# Patient Record
Sex: Female | Born: 1965 | Race: White | Hispanic: No | Marital: Married | Smoking: Never smoker
Health system: Southern US, Community
[De-identification: ages and names within clinical notes are randomized; demographics above are authoritative.]

## PROBLEM LIST (undated history)

## (undated) DIAGNOSIS — I10 Essential (primary) hypertension: Secondary | ICD-10-CM

## (undated) DIAGNOSIS — I219 Acute myocardial infarction, unspecified: Secondary | ICD-10-CM

## (undated) DIAGNOSIS — D649 Anemia, unspecified: Secondary | ICD-10-CM

## (undated) DIAGNOSIS — Z9981 Dependence on supplemental oxygen: Secondary | ICD-10-CM

## (undated) DIAGNOSIS — Z9289 Personal history of other medical treatment: Secondary | ICD-10-CM

## (undated) DIAGNOSIS — J189 Pneumonia, unspecified organism: Secondary | ICD-10-CM

## (undated) DIAGNOSIS — F329 Major depressive disorder, single episode, unspecified: Secondary | ICD-10-CM

## (undated) DIAGNOSIS — F32A Depression, unspecified: Secondary | ICD-10-CM

## (undated) DIAGNOSIS — N189 Chronic kidney disease, unspecified: Secondary | ICD-10-CM

## (undated) DIAGNOSIS — R413 Other amnesia: Secondary | ICD-10-CM

## (undated) DIAGNOSIS — G43909 Migraine, unspecified, not intractable, without status migrainosus: Secondary | ICD-10-CM

## (undated) DIAGNOSIS — R011 Cardiac murmur, unspecified: Secondary | ICD-10-CM

## (undated) DIAGNOSIS — E119 Type 2 diabetes mellitus without complications: Secondary | ICD-10-CM

## (undated) HISTORY — PX: CHOLECYSTECTOMY OPEN: SUR202

---

## 2014-02-15 HISTORY — PX: FOOT AMPUTATION THROUGH METATARSAL: SHX644

## 2016-08-30 DIAGNOSIS — M86171 Other acute osteomyelitis, right ankle and foot: Secondary | ICD-10-CM

## 2016-08-30 DIAGNOSIS — Z89432 Acquired absence of left foot: Secondary | ICD-10-CM

## 2016-08-30 DIAGNOSIS — E1140 Type 2 diabetes mellitus with diabetic neuropathy, unspecified: Secondary | ICD-10-CM

## 2016-08-30 DIAGNOSIS — E860 Dehydration: Secondary | ICD-10-CM

## 2016-08-30 DIAGNOSIS — E11621 Type 2 diabetes mellitus with foot ulcer: Secondary | ICD-10-CM

## 2016-08-30 DIAGNOSIS — N179 Acute kidney failure, unspecified: Secondary | ICD-10-CM

## 2016-08-30 DIAGNOSIS — E109 Type 1 diabetes mellitus without complications: Secondary | ICD-10-CM

## 2016-08-30 DIAGNOSIS — I10 Essential (primary) hypertension: Secondary | ICD-10-CM

## 2016-08-30 DIAGNOSIS — D649 Anemia, unspecified: Secondary | ICD-10-CM

## 2016-08-31 DIAGNOSIS — M86171 Other acute osteomyelitis, right ankle and foot: Secondary | ICD-10-CM

## 2016-08-31 DIAGNOSIS — N179 Acute kidney failure, unspecified: Secondary | ICD-10-CM

## 2016-08-31 DIAGNOSIS — E860 Dehydration: Secondary | ICD-10-CM

## 2016-08-31 DIAGNOSIS — E119 Type 2 diabetes mellitus without complications: Secondary | ICD-10-CM

## 2016-08-31 DIAGNOSIS — Z89432 Acquired absence of left foot: Secondary | ICD-10-CM

## 2016-08-31 DIAGNOSIS — E11621 Type 2 diabetes mellitus with foot ulcer: Secondary | ICD-10-CM

## 2016-08-31 DIAGNOSIS — J9601 Acute respiratory failure with hypoxia: Secondary | ICD-10-CM

## 2016-09-01 DIAGNOSIS — E11621 Type 2 diabetes mellitus with foot ulcer: Secondary | ICD-10-CM

## 2016-09-01 DIAGNOSIS — M86171 Other acute osteomyelitis, right ankle and foot: Secondary | ICD-10-CM

## 2016-09-13 DIAGNOSIS — E11621 Type 2 diabetes mellitus with foot ulcer: Secondary | ICD-10-CM

## 2016-09-13 DIAGNOSIS — J90 Pleural effusion, not elsewhere classified: Secondary | ICD-10-CM

## 2016-09-13 DIAGNOSIS — R079 Chest pain, unspecified: Secondary | ICD-10-CM

## 2016-09-13 DIAGNOSIS — I10 Essential (primary) hypertension: Secondary | ICD-10-CM

## 2016-09-13 DIAGNOSIS — N189 Chronic kidney disease, unspecified: Secondary | ICD-10-CM

## 2016-09-15 ENCOUNTER — Encounter: Payer: Self-pay | Admitting: Sports Medicine

## 2016-09-22 ENCOUNTER — Ambulatory Visit: Payer: Self-pay | Admitting: Sports Medicine

## 2016-09-23 DIAGNOSIS — E860 Dehydration: Secondary | ICD-10-CM

## 2016-09-23 DIAGNOSIS — M86171 Other acute osteomyelitis, right ankle and foot: Secondary | ICD-10-CM

## 2016-09-23 DIAGNOSIS — J189 Pneumonia, unspecified organism: Secondary | ICD-10-CM

## 2016-09-23 DIAGNOSIS — J9601 Acute respiratory failure with hypoxia: Secondary | ICD-10-CM

## 2016-09-23 DIAGNOSIS — E119 Type 2 diabetes mellitus without complications: Secondary | ICD-10-CM

## 2016-09-23 DIAGNOSIS — N179 Acute kidney failure, unspecified: Secondary | ICD-10-CM

## 2016-09-25 DIAGNOSIS — I10 Essential (primary) hypertension: Secondary | ICD-10-CM

## 2016-09-25 DIAGNOSIS — J189 Pneumonia, unspecified organism: Secondary | ICD-10-CM

## 2016-09-25 DIAGNOSIS — N179 Acute kidney failure, unspecified: Secondary | ICD-10-CM

## 2016-09-25 DIAGNOSIS — R109 Unspecified abdominal pain: Secondary | ICD-10-CM

## 2016-09-25 DIAGNOSIS — J9601 Acute respiratory failure with hypoxia: Secondary | ICD-10-CM

## 2016-09-27 DIAGNOSIS — J90 Pleural effusion, not elsewhere classified: Secondary | ICD-10-CM

## 2016-09-27 DIAGNOSIS — J9601 Acute respiratory failure with hypoxia: Secondary | ICD-10-CM

## 2016-09-27 DIAGNOSIS — E119 Type 2 diabetes mellitus without complications: Secondary | ICD-10-CM

## 2016-10-26 ENCOUNTER — Ambulatory Visit: Payer: Self-pay | Admitting: Sports Medicine

## 2016-10-28 DIAGNOSIS — E872 Acidosis: Secondary | ICD-10-CM

## 2016-10-28 DIAGNOSIS — E860 Dehydration: Secondary | ICD-10-CM

## 2016-10-28 DIAGNOSIS — Z89432 Acquired absence of left foot: Secondary | ICD-10-CM

## 2016-10-28 DIAGNOSIS — N183 Chronic kidney disease, stage 3 (moderate): Secondary | ICD-10-CM

## 2016-10-28 DIAGNOSIS — R112 Nausea with vomiting, unspecified: Secondary | ICD-10-CM

## 2016-10-28 DIAGNOSIS — E1140 Type 2 diabetes mellitus with diabetic neuropathy, unspecified: Secondary | ICD-10-CM

## 2016-10-28 DIAGNOSIS — E11621 Type 2 diabetes mellitus with foot ulcer: Secondary | ICD-10-CM

## 2016-10-28 DIAGNOSIS — I10 Essential (primary) hypertension: Secondary | ICD-10-CM

## 2016-11-03 ENCOUNTER — Ambulatory Visit: Payer: Self-pay | Admitting: Internal Medicine

## 2016-12-27 DIAGNOSIS — R112 Nausea with vomiting, unspecified: Secondary | ICD-10-CM | POA: Diagnosis not present

## 2016-12-27 DIAGNOSIS — N183 Chronic kidney disease, stage 3 (moderate): Secondary | ICD-10-CM | POA: Diagnosis not present

## 2016-12-27 DIAGNOSIS — E1140 Type 2 diabetes mellitus with diabetic neuropathy, unspecified: Secondary | ICD-10-CM

## 2016-12-27 DIAGNOSIS — I10 Essential (primary) hypertension: Secondary | ICD-10-CM

## 2016-12-27 DIAGNOSIS — D649 Anemia, unspecified: Secondary | ICD-10-CM

## 2016-12-28 DIAGNOSIS — E1140 Type 2 diabetes mellitus with diabetic neuropathy, unspecified: Secondary | ICD-10-CM | POA: Diagnosis not present

## 2016-12-28 DIAGNOSIS — I10 Essential (primary) hypertension: Secondary | ICD-10-CM | POA: Diagnosis not present

## 2016-12-28 DIAGNOSIS — N183 Chronic kidney disease, stage 3 (moderate): Secondary | ICD-10-CM | POA: Diagnosis not present

## 2016-12-28 DIAGNOSIS — D649 Anemia, unspecified: Secondary | ICD-10-CM | POA: Diagnosis not present

## 2016-12-29 DIAGNOSIS — I10 Essential (primary) hypertension: Secondary | ICD-10-CM | POA: Diagnosis not present

## 2016-12-29 DIAGNOSIS — E1140 Type 2 diabetes mellitus with diabetic neuropathy, unspecified: Secondary | ICD-10-CM | POA: Diagnosis not present

## 2016-12-29 DIAGNOSIS — N183 Chronic kidney disease, stage 3 (moderate): Secondary | ICD-10-CM | POA: Diagnosis not present

## 2016-12-29 DIAGNOSIS — D649 Anemia, unspecified: Secondary | ICD-10-CM | POA: Diagnosis not present

## 2016-12-30 ENCOUNTER — Encounter (HOSPITAL_COMMUNITY): Payer: Self-pay | Admitting: General Practice

## 2016-12-30 ENCOUNTER — Inpatient Hospital Stay (HOSPITAL_COMMUNITY): Payer: Medicaid Other

## 2016-12-30 ENCOUNTER — Inpatient Hospital Stay (HOSPITAL_COMMUNITY)
Admission: AD | Admit: 2016-12-30 | Discharge: 2017-01-08 | DRG: 193 | Disposition: A | Discharge status: Home Health | Payer: Medicaid Other | Source: Other Acute Inpatient Hospital | Attending: Internal Medicine | Admitting: Internal Medicine

## 2016-12-30 DIAGNOSIS — J80 Acute respiratory distress syndrome: Secondary | ICD-10-CM | POA: Diagnosis present

## 2016-12-30 DIAGNOSIS — I469 Cardiac arrest, cause unspecified: Secondary | ICD-10-CM | POA: Diagnosis not present

## 2016-12-30 DIAGNOSIS — I130 Hypertensive heart and chronic kidney disease with heart failure and stage 1 through stage 4 chronic kidney disease, or unspecified chronic kidney disease: Secondary | ICD-10-CM | POA: Diagnosis present

## 2016-12-30 DIAGNOSIS — E1140 Type 2 diabetes mellitus with diabetic neuropathy, unspecified: Secondary | ICD-10-CM | POA: Diagnosis present

## 2016-12-30 DIAGNOSIS — R0902 Hypoxemia: Secondary | ICD-10-CM

## 2016-12-30 DIAGNOSIS — T8789 Other complications of amputation stump: Secondary | ICD-10-CM | POA: Diagnosis present

## 2016-12-30 DIAGNOSIS — Y95 Nosocomial condition: Secondary | ICD-10-CM | POA: Diagnosis present

## 2016-12-30 DIAGNOSIS — R509 Fever, unspecified: Secondary | ICD-10-CM

## 2016-12-30 DIAGNOSIS — J9690 Respiratory failure, unspecified, unspecified whether with hypoxia or hypercapnia: Secondary | ICD-10-CM

## 2016-12-30 DIAGNOSIS — R918 Other nonspecific abnormal finding of lung field: Secondary | ICD-10-CM

## 2016-12-30 DIAGNOSIS — E874 Mixed disorder of acid-base balance: Secondary | ICD-10-CM | POA: Diagnosis present

## 2016-12-30 DIAGNOSIS — E875 Hyperkalemia: Secondary | ICD-10-CM | POA: Diagnosis not present

## 2016-12-30 DIAGNOSIS — E1151 Type 2 diabetes mellitus with diabetic peripheral angiopathy without gangrene: Secondary | ICD-10-CM | POA: Diagnosis present

## 2016-12-30 DIAGNOSIS — E872 Acidosis: Secondary | ICD-10-CM | POA: Diagnosis not present

## 2016-12-30 DIAGNOSIS — I5031 Acute diastolic (congestive) heart failure: Secondary | ICD-10-CM | POA: Diagnosis present

## 2016-12-30 DIAGNOSIS — G43909 Migraine, unspecified, not intractable, without status migrainosus: Secondary | ICD-10-CM | POA: Diagnosis not present

## 2016-12-30 DIAGNOSIS — J181 Lobar pneumonia, unspecified organism: Principal | ICD-10-CM

## 2016-12-30 DIAGNOSIS — N184 Chronic kidney disease, stage 4 (severe): Secondary | ICD-10-CM | POA: Diagnosis present

## 2016-12-30 DIAGNOSIS — L97522 Non-pressure chronic ulcer of other part of left foot with fat layer exposed: Secondary | ICD-10-CM | POA: Diagnosis not present

## 2016-12-30 DIAGNOSIS — I2510 Atherosclerotic heart disease of native coronary artery without angina pectoris: Secondary | ICD-10-CM | POA: Diagnosis not present

## 2016-12-30 DIAGNOSIS — Z794 Long term (current) use of insulin: Secondary | ICD-10-CM

## 2016-12-30 DIAGNOSIS — D631 Anemia in chronic kidney disease: Secondary | ICD-10-CM | POA: Diagnosis present

## 2016-12-30 DIAGNOSIS — I2699 Other pulmonary embolism without acute cor pulmonale: Secondary | ICD-10-CM

## 2016-12-30 DIAGNOSIS — Z833 Family history of diabetes mellitus: Secondary | ICD-10-CM | POA: Diagnosis not present

## 2016-12-30 DIAGNOSIS — E08621 Diabetes mellitus due to underlying condition with foot ulcer: Secondary | ICD-10-CM | POA: Diagnosis not present

## 2016-12-30 DIAGNOSIS — J9601 Acute respiratory failure with hypoxia: Secondary | ICD-10-CM

## 2016-12-30 DIAGNOSIS — G4733 Obstructive sleep apnea (adult) (pediatric): Secondary | ICD-10-CM | POA: Diagnosis present

## 2016-12-30 DIAGNOSIS — J9600 Acute respiratory failure, unspecified whether with hypoxia or hypercapnia: Secondary | ICD-10-CM | POA: Diagnosis not present

## 2016-12-30 DIAGNOSIS — I10 Essential (primary) hypertension: Secondary | ICD-10-CM | POA: Diagnosis not present

## 2016-12-30 DIAGNOSIS — L97529 Non-pressure chronic ulcer of other part of left foot with unspecified severity: Secondary | ICD-10-CM | POA: Diagnosis not present

## 2016-12-30 DIAGNOSIS — E1122 Type 2 diabetes mellitus with diabetic chronic kidney disease: Secondary | ICD-10-CM | POA: Diagnosis present

## 2016-12-30 DIAGNOSIS — Z9049 Acquired absence of other specified parts of digestive tract: Secondary | ICD-10-CM | POA: Diagnosis not present

## 2016-12-30 DIAGNOSIS — I252 Old myocardial infarction: Secondary | ICD-10-CM | POA: Diagnosis not present

## 2016-12-30 DIAGNOSIS — N179 Acute kidney failure, unspecified: Secondary | ICD-10-CM | POA: Diagnosis present

## 2016-12-30 DIAGNOSIS — R1310 Dysphagia, unspecified: Secondary | ICD-10-CM | POA: Diagnosis not present

## 2016-12-30 DIAGNOSIS — J189 Pneumonia, unspecified organism: Secondary | ICD-10-CM | POA: Diagnosis present

## 2016-12-30 DIAGNOSIS — Z91040 Latex allergy status: Secondary | ICD-10-CM

## 2016-12-30 DIAGNOSIS — N170 Acute kidney failure with tubular necrosis: Secondary | ICD-10-CM | POA: Diagnosis not present

## 2016-12-30 DIAGNOSIS — E1165 Type 2 diabetes mellitus with hyperglycemia: Secondary | ICD-10-CM | POA: Diagnosis present

## 2016-12-30 DIAGNOSIS — Z9981 Dependence on supplemental oxygen: Secondary | ICD-10-CM

## 2016-12-30 DIAGNOSIS — N183 Chronic kidney disease, stage 3 unspecified: Secondary | ICD-10-CM | POA: Diagnosis present

## 2016-12-30 DIAGNOSIS — E11621 Type 2 diabetes mellitus with foot ulcer: Secondary | ICD-10-CM | POA: Diagnosis present

## 2016-12-30 DIAGNOSIS — E871 Hypo-osmolality and hyponatremia: Secondary | ICD-10-CM | POA: Diagnosis not present

## 2016-12-30 DIAGNOSIS — I429 Cardiomyopathy, unspecified: Secondary | ICD-10-CM | POA: Diagnosis not present

## 2016-12-30 DIAGNOSIS — D649 Anemia, unspecified: Secondary | ICD-10-CM | POA: Diagnosis not present

## 2016-12-30 DIAGNOSIS — L97509 Non-pressure chronic ulcer of other part of unspecified foot with unspecified severity: Secondary | ICD-10-CM | POA: Diagnosis present

## 2016-12-30 DIAGNOSIS — R011 Cardiac murmur, unspecified: Secondary | ICD-10-CM | POA: Diagnosis present

## 2016-12-30 HISTORY — DX: Acute myocardial infarction, unspecified: I21.9

## 2016-12-30 HISTORY — DX: Depression, unspecified: F32.A

## 2016-12-30 HISTORY — DX: Type 2 diabetes mellitus without complications: E11.9

## 2016-12-30 HISTORY — DX: Essential (primary) hypertension: I10

## 2016-12-30 HISTORY — DX: Other amnesia: R41.3

## 2016-12-30 HISTORY — DX: Chronic kidney disease, unspecified: N18.9

## 2016-12-30 HISTORY — DX: Anemia, unspecified: D64.9

## 2016-12-30 HISTORY — DX: Personal history of other medical treatment: Z92.89

## 2016-12-30 HISTORY — DX: Major depressive disorder, single episode, unspecified: F32.9

## 2016-12-30 HISTORY — DX: Cardiac murmur, unspecified: R01.1

## 2016-12-30 HISTORY — DX: Pneumonia, unspecified organism: J18.9

## 2016-12-30 HISTORY — DX: Migraine, unspecified, not intractable, without status migrainosus: G43.909

## 2016-12-30 HISTORY — DX: Dependence on supplemental oxygen: Z99.81

## 2016-12-30 LAB — BLOOD GAS, ARTERIAL
Acid-base deficit: 6.8 mmol/L — ABNORMAL HIGH (ref 0.0–2.0)
Acid-base deficit: 10.4 mmol/L — ABNORMAL HIGH (ref 0.0–2.0)
BICARBONATE: 15.8 mmol/L — AB (ref 20.0–28.0)
Bicarbonate: 18.7 mmol/L — ABNORMAL LOW (ref 20.0–28.0)
DRAWN BY: 27553
Drawn by: 448981
FIO2: 80
FIO2: 100
O2 Saturation: 96.2 %
O2 Saturation: 94 %
PATIENT TEMPERATURE: 98.6
PCO2 ART: 40.6 mmHg (ref 32.0–48.0)
PH ART: 7.285 — AB (ref 7.350–7.450)
PH ART: 7.219 — AB (ref 7.350–7.450)
PO2 ART: 87.4 mmHg (ref 83.0–108.0)
Patient temperature: 98.6
pCO2 arterial: 40.3 mmHg (ref 32.0–48.0)
pO2, Arterial: 80.7 mmHg — ABNORMAL LOW (ref 83.0–108.0)

## 2016-12-30 LAB — CBC WITH DIFFERENTIAL/PLATELET
Basophils Absolute: 0.1 10*3/uL (ref 0.0–0.1)
Basophils Relative: 1 %
Eosinophils Absolute: 0.2 10*3/uL (ref 0.0–0.7)
Eosinophils Relative: 3 %
HCT: 23.1 % — ABNORMAL LOW (ref 36.0–46.0)
Hemoglobin: 7.1 g/dL — ABNORMAL LOW (ref 12.0–15.0)
LYMPHS ABS: 1.9 10*3/uL (ref 0.7–4.0)
LYMPHS PCT: 22 %
MCH: 25.7 pg — AB (ref 26.0–34.0)
MCHC: 30.7 g/dL (ref 30.0–36.0)
MCV: 83.7 fL (ref 78.0–100.0)
MONO ABS: 0.8 10*3/uL (ref 0.1–1.0)
Monocytes Relative: 9 %
Neutro Abs: 5.6 10*3/uL (ref 1.7–7.7)
Neutrophils Relative %: 65 %
PLATELETS: 362 10*3/uL (ref 150–400)
RBC: 2.76 MIL/uL — AB (ref 3.87–5.11)
RDW: 16.4 % — ABNORMAL HIGH (ref 11.5–15.5)
WBC: 8.6 10*3/uL (ref 4.0–10.5)

## 2016-12-30 LAB — COMPREHENSIVE METABOLIC PANEL
ALBUMIN: 2.2 g/dL — AB (ref 3.5–5.0)
ALT: 19 U/L (ref 14–54)
AST: 23 U/L (ref 15–41)
Alkaline Phosphatase: 154 U/L — ABNORMAL HIGH (ref 38–126)
Anion gap: 10 (ref 5–15)
BILIRUBIN TOTAL: 0.6 mg/dL (ref 0.3–1.2)
BUN: 29 mg/dL — AB (ref 6–20)
CHLORIDE: 103 mmol/L (ref 101–111)
CO2: 17 mmol/L — AB (ref 22–32)
CREATININE: 3.51 mg/dL — AB (ref 0.44–1.00)
Calcium: 7.7 mg/dL — ABNORMAL LOW (ref 8.9–10.3)
GFR calc Af Amer: 16 mL/min — ABNORMAL LOW (ref >60)
GFR calc non Af Amer: 14 mL/min — ABNORMAL LOW (ref >60)
GLUCOSE: 163 mg/dL — AB (ref 65–99)
Potassium: 4.7 mmol/L (ref 3.5–5.1)
SODIUM: 130 mmol/L — AB (ref 135–145)
Total Protein: 6.7 g/dL (ref 6.5–8.1)

## 2016-12-30 LAB — PROCALCITONIN: Procalcitonin: 0.79 ng/mL

## 2016-12-30 LAB — BASIC METABOLIC PANEL
Anion gap: 10 (ref 5–15)
BUN: 29 mg/dL — AB (ref 6–20)
CHLORIDE: 103 mmol/L (ref 101–111)
CO2: 19 mmol/L — AB (ref 22–32)
Calcium: 7.8 mg/dL — ABNORMAL LOW (ref 8.9–10.3)
Creatinine, Ser: 3.48 mg/dL — ABNORMAL HIGH (ref 0.44–1.00)
GFR calc Af Amer: 17 mL/min — ABNORMAL LOW (ref >60)
GFR calc non Af Amer: 14 mL/min — ABNORMAL LOW (ref >60)
GLUCOSE: 121 mg/dL — AB (ref 65–99)
POTASSIUM: 4.8 mmol/L (ref 3.5–5.1)
Sodium: 132 mmol/L — ABNORMAL LOW (ref 135–145)

## 2016-12-30 LAB — URINALYSIS, ROUTINE W REFLEX MICROSCOPIC
BILIRUBIN URINE: NEGATIVE
BILIRUBIN URINE: NEGATIVE
Glucose, UA: 150 mg/dL — AB
HGB URINE DIPSTICK: NEGATIVE
KETONES UR: NEGATIVE mg/dL
Ketones, ur: NEGATIVE mg/dL
LEUKOCYTES UA: NEGATIVE
Nitrite: NEGATIVE
Nitrite: NEGATIVE
PH: 5 (ref 5.0–8.0)
PH: 5 (ref 5.0–8.0)
Protein, ur: 100 mg/dL — AB
SPECIFIC GRAVITY, URINE: 1.015 (ref 1.005–1.030)
Specific Gravity, Urine: 1.013 (ref 1.005–1.030)

## 2016-12-30 LAB — CBC
HEMATOCRIT: 24 % — AB (ref 36.0–46.0)
HEMOGLOBIN: 7.4 g/dL — AB (ref 12.0–15.0)
MCH: 25.7 pg — AB (ref 26.0–34.0)
MCHC: 30.8 g/dL (ref 30.0–36.0)
MCV: 83.3 fL (ref 78.0–100.0)
Platelets: 361 10*3/uL (ref 150–400)
RBC: 2.88 MIL/uL — ABNORMAL LOW (ref 3.87–5.11)
RDW: 16.4 % — ABNORMAL HIGH (ref 11.5–15.5)
WBC: 9.5 10*3/uL (ref 4.0–10.5)

## 2016-12-30 LAB — MRSA PCR SCREENING: MRSA by PCR: NEGATIVE

## 2016-12-30 LAB — TSH: TSH: 2.946 u[IU]/mL (ref 0.350–4.500)

## 2016-12-30 LAB — APTT: aPTT: 36 seconds (ref 24–36)

## 2016-12-30 LAB — GLUCOSE, CAPILLARY
Glucose-Capillary: 159 mg/dL — ABNORMAL HIGH (ref 65–99)
Glucose-Capillary: 217 mg/dL — ABNORMAL HIGH (ref 65–99)
Glucose-Capillary: 249 mg/dL — ABNORMAL HIGH (ref 65–99)
Glucose-Capillary: 119 mg/dL — ABNORMAL HIGH (ref 65–99)

## 2016-12-30 LAB — PREALBUMIN: Prealbumin: 11.5 mg/dL — ABNORMAL LOW (ref 18–38)

## 2016-12-30 LAB — PROTIME-INR
INR: 1.17
Prothrombin Time: 14.9 seconds (ref 11.4–15.2)

## 2016-12-30 LAB — STREP PNEUMONIAE URINARY ANTIGEN: Strep Pneumo Urinary Antigen: NEGATIVE

## 2016-12-30 LAB — MAGNESIUM
Magnesium: 1.7 mg/dL (ref 1.7–2.4)
Magnesium: 1.7 mg/dL (ref 1.7–2.4)

## 2016-12-30 LAB — LACTIC ACID, PLASMA: Lactic Acid, Venous: 0.8 mmol/L (ref 0.5–1.9)

## 2016-12-30 LAB — VANCOMYCIN, RANDOM: Vancomycin Rm: 17

## 2016-12-30 LAB — PHOSPHORUS: Phosphorus: 3.9 mg/dL (ref 2.5–4.6)

## 2016-12-30 LAB — TROPONIN I: TROPONIN I: 0.04 ng/mL — AB (ref <0.03)

## 2016-12-30 MED ORDER — ORAL CARE MOUTH RINSE
15.0000 mL | Freq: Two times a day (BID) | OROMUCOSAL | Status: DC
  Administered 2016-12-30 – 2017-01-08 (×12): 15 mL via OROMUCOSAL

## 2016-12-30 MED ORDER — ACETAMINOPHEN 325 MG PO TABS
650.0000 mg | ORAL_TABLET | Freq: Four times a day (QID) | ORAL | Status: DC | PRN
  Administered 2016-12-31 – 2017-01-07 (×9): 650 mg via ORAL
  Filled 2016-12-30 (×10): qty 2

## 2016-12-30 MED ORDER — AZITHROMYCIN 250 MG PO TABS: 250.0000 mg | ORAL_TABLET | Freq: Every day | ORAL | Status: DC

## 2016-12-30 MED ORDER — STERILE WATER FOR INJECTION IV SOLN
INTRAVENOUS | Status: DC
  Administered 2016-12-30: 21:00:00 via INTRAVENOUS
  Filled 2016-12-30 (×3): qty 850

## 2016-12-30 MED ORDER — ZOLPIDEM TARTRATE 5 MG PO TABS: 5.0000 mg | ORAL_TABLET | Freq: Every evening | ORAL | Status: DC | PRN

## 2016-12-30 MED ORDER — SODIUM CHLORIDE 0.9% FLUSH: 3.0000 mL | INTRAVENOUS | Status: DC | PRN

## 2016-12-30 MED ORDER — ACETAMINOPHEN 650 MG RE SUPP: 650.0000 mg | Freq: Four times a day (QID) | RECTAL | Status: DC | PRN

## 2016-12-30 MED ORDER — POTASSIUM CHLORIDE IN NACL 20-0.9 MEQ/L-% IV SOLN
INTRAVENOUS | Status: DC
  Administered 2016-12-30: 13:00:00 via INTRAVENOUS
  Filled 2016-12-30: qty 1000

## 2016-12-30 MED ORDER — TRAMADOL HCL 50 MG PO TABS: 50.0000 mg | ORAL_TABLET | Freq: Four times a day (QID) | ORAL | Status: DC | PRN

## 2016-12-30 MED ORDER — LEVOTHYROXINE SODIUM 25 MCG PO TABS
25.0000 ug | ORAL_TABLET | Freq: Every day | ORAL | Status: DC
  Filled 2016-12-30: qty 1

## 2016-12-30 MED ORDER — ONDANSETRON HCL 4 MG PO TABS
4.0000 mg | ORAL_TABLET | Freq: Four times a day (QID) | ORAL | Status: DC | PRN
  Administered 2016-12-30: 4 mg via ORAL
  Filled 2016-12-30: qty 1

## 2016-12-30 MED ORDER — BISACODYL 5 MG PO TBEC: 5.0000 mg | DELAYED_RELEASE_TABLET | Freq: Every day | ORAL | Status: DC | PRN

## 2016-12-30 MED ORDER — ADULT MULTIVITAMIN W/MINERALS CH: 1.0000 | ORAL_TABLET | Freq: Every day | ORAL | Status: DC

## 2016-12-30 MED ORDER — GABAPENTIN 600 MG PO TABS
1200.0000 mg | ORAL_TABLET | Freq: Three times a day (TID) | ORAL | Status: DC
  Administered 2016-12-30: 1200 mg via ORAL
  Filled 2016-12-30 (×2): qty 2

## 2016-12-30 MED ORDER — INSULIN ASPART 100 UNIT/ML ~~LOC~~ SOLN
2.0000 [IU] | SUBCUTANEOUS | Status: DC
  Administered 2016-12-31: 2 [IU] via SUBCUTANEOUS
  Administered 2016-12-31: 4 [IU] via SUBCUTANEOUS

## 2016-12-30 MED ORDER — SENNOSIDES-DOCUSATE SODIUM 8.6-50 MG PO TABS
1.0000 | ORAL_TABLET | Freq: Every evening | ORAL | Status: DC | PRN
  Filled 2016-12-30: qty 1

## 2016-12-30 MED ORDER — SODIUM CHLORIDE 0.9 % IV SOLN
500.0000 mg | Freq: Two times a day (BID) | INTRAVENOUS | Status: DC
  Administered 2016-12-30: 500 mg via INTRAVENOUS
  Filled 2016-12-30: qty 500

## 2016-12-30 MED ORDER — DEXTROSE 5 % IV SOLN
500.0000 mg | INTRAVENOUS | Status: DC
  Administered 2016-12-31 – 2017-01-02 (×3): 500 mg via INTRAVENOUS
  Filled 2016-12-30 (×4): qty 500

## 2016-12-30 MED ORDER — OXYCODONE HCL 5 MG PO TABS: 5.0000 mg | ORAL_TABLET | ORAL | Status: DC | PRN

## 2016-12-30 MED ORDER — ONDANSETRON HCL 4 MG/2ML IJ SOLN
4.0000 mg | Freq: Four times a day (QID) | INTRAMUSCULAR | Status: DC | PRN
Start: 2016-12-30 — End: 2017-01-08
  Administered 2016-12-30 – 2017-01-06 (×5): 4 mg via INTRAVENOUS
  Filled 2016-12-30 (×6): qty 2

## 2016-12-30 MED ORDER — SODIUM CHLORIDE 0.9 % IV SOLN: 250.0000 mL | INTRAVENOUS | Status: DC | PRN

## 2016-12-30 MED ORDER — LEVOTHYROXINE SODIUM 100 MCG IV SOLR
12.5000 ug | Freq: Every day | INTRAVENOUS | Status: DC
  Administered 2016-12-31 – 2017-01-02 (×3): 12.5 ug via INTRAVENOUS
  Filled 2016-12-30 (×3): qty 5

## 2016-12-30 MED ORDER — HEPARIN SODIUM (PORCINE) 5000 UNIT/ML IJ SOLN
5000.0000 [IU] | Freq: Three times a day (TID) | INTRAMUSCULAR | Status: DC
  Administered 2016-12-30 – 2017-01-08 (×25): 5000 [IU] via SUBCUTANEOUS
  Filled 2016-12-30 (×28): qty 1

## 2016-12-30 MED ORDER — SODIUM CHLORIDE 0.9 % IV SOLN
1000.0000 mg | Freq: Once | INTRAVENOUS | Status: DC
  Filled 2016-12-30: qty 1000

## 2016-12-30 MED ORDER — AZITHROMYCIN 500 MG PO TABS
500.0000 mg | ORAL_TABLET | Freq: Every day | ORAL | Status: AC
  Administered 2016-12-30: 500 mg via ORAL
  Filled 2016-12-30: qty 1

## 2016-12-30 MED ORDER — VANCOMYCIN HCL IN DEXTROSE 1-5 GM/200ML-% IV SOLN
1000.0000 mg | INTRAVENOUS | Status: DC
  Filled 2016-12-30: qty 200

## 2016-12-30 MED ORDER — INSULIN ASPART 100 UNIT/ML ~~LOC~~ SOLN
0.0000 [IU] | Freq: Three times a day (TID) | SUBCUTANEOUS | Status: DC
  Administered 2016-12-30: 5 [IU] via SUBCUTANEOUS
  Administered 2016-12-30: 3 [IU] via SUBCUTANEOUS

## 2016-12-30 MED ORDER — JUVEN PO PACK
1.0000 | PACK | Freq: Two times a day (BID) | ORAL | Status: DC
  Administered 2016-12-30: 1 via ORAL
  Filled 2016-12-30: qty 1

## 2016-12-30 MED ORDER — SODIUM CHLORIDE 0.9% FLUSH
3.0000 mL | Freq: Two times a day (BID) | INTRAVENOUS | Status: DC
  Administered 2017-01-02: 3 mL via INTRAVENOUS

## 2016-12-30 MED ORDER — FENTANYL CITRATE (PF) 100 MCG/2ML IJ SOLN
12.5000 ug | INTRAMUSCULAR | Status: DC | PRN
  Administered 2016-12-30 – 2017-01-04 (×9): 12.5 ug via INTRAVENOUS
  Filled 2016-12-30 (×9): qty 2

## 2016-12-30 MED ORDER — INSULIN ASPART 100 UNIT/ML ~~LOC~~ SOLN
4.0000 [IU] | Freq: Three times a day (TID) | SUBCUTANEOUS | Status: DC
  Administered 2016-12-30 (×2): 4 [IU] via SUBCUTANEOUS

## 2016-12-30 MED ORDER — PANTOPRAZOLE SODIUM 40 MG IV SOLR
40.0000 mg | INTRAVENOUS | Status: DC
  Administered 2016-12-31 – 2017-01-02 (×3): 40 mg via INTRAVENOUS
  Filled 2016-12-30 (×3): qty 40

## 2016-12-30 MED ORDER — INSULIN ASPART 100 UNIT/ML ~~LOC~~ SOLN: 0.0000 [IU] | Freq: Every day | SUBCUTANEOUS | Status: DC

## 2016-12-30 MED ORDER — COLLAGENASE 250 UNIT/GM EX OINT
TOPICAL_OINTMENT | Freq: Every day | CUTANEOUS | Status: DC
  Administered 2016-12-30 – 2016-12-31 (×2): via TOPICAL
  Administered 2017-01-01: 1 via TOPICAL
  Administered 2017-01-02 – 2017-01-08 (×8): via TOPICAL
  Filled 2016-12-30 (×3): qty 30

## 2016-12-30 MED ORDER — IPRATROPIUM-ALBUTEROL 0.5-2.5 (3) MG/3ML IN SOLN
3.0000 mL | Freq: Four times a day (QID) | RESPIRATORY_TRACT | Status: DC
  Administered 2016-12-30 – 2017-01-04 (×18): 3 mL via RESPIRATORY_TRACT
  Filled 2016-12-30 (×18): qty 3

## 2016-12-30 MED ORDER — SERTRALINE HCL 50 MG PO TABS: 50.0000 mg | ORAL_TABLET | Freq: Every day | ORAL | Status: DC

## 2016-12-30 MED ORDER — PIPERACILLIN-TAZOBACTAM IN DEX 2-0.25 GM/50ML IV SOLN
2.2500 g | Freq: Three times a day (TID) | INTRAVENOUS | Status: DC
  Administered 2016-12-30 – 2017-01-03 (×12): 2.25 g via INTRAVENOUS
  Filled 2016-12-30 (×16): qty 50

## 2016-12-30 MED ORDER — SODIUM CHLORIDE 0.9% FLUSH
3.0000 mL | Freq: Two times a day (BID) | INTRAVENOUS | Status: DC
  Administered 2016-12-30 – 2017-01-08 (×10): 3 mL via INTRAVENOUS

## 2016-12-30 MED ORDER — SODIUM CHLORIDE 0.9 % IV SOLN: INTRAVENOUS | Status: DC

## 2016-12-30 NOTE — Progress Notes (Signed)
Patient arrived to 4NP11 via stretcher, CHG bath given, VSS, 15L NRB w/ sats in upper 90's. Pt complains of 8 out of 10 generalized chronic pain. Skin assessed. Pt oriented to room and POC, currently resting comfortably in bed. Dr. Willette PaSheehan paged to notify of patients arrival and for orders. Will continue to monitor.

## 2016-12-30 NOTE — Progress Notes (Addendum)
S:  Called to bedside after cardiac arrest. Patient reportedly went to CT, after arrival back to room patient was found without oxygen (has been dependent on NRB for last 3 days) and pulseless in PEA. CPR was started and ROSC returned after about 6 minutes.    O:  Blood pressure 113/61, pulse 85, temperature 99.5 F (37.5 C), temperature source Oral, resp. rate 20, height 5\' 4"  (1.626 m), weight 85.8 kg (189 lb 2.5 oz), last menstrual period 12/07/2016, SpO2 93 %.   General: Adult female, no distress  Neuro: Alert, oriented, follows commands  CV: Tachy, Regular, no MRG PULM: Exp wheezes, bibasilar crackles, non-labored  GI: obese, active bowel sounds Extremities: warm, dry    CBC    Component Value Date/Time   WBC 9.5 12/30/2016 1903   RBC 2.88 (L) 12/30/2016 1903   HGB 7.4 (L) 12/30/2016 1903   HCT 24.0 (L) 12/30/2016 1903   PLT 361 12/30/2016 1903   MCV 83.3 12/30/2016 1903   MCH 25.7 (L) 12/30/2016 1903   MCHC 30.8 12/30/2016 1903   RDW 16.4 (H) 12/30/2016 1903   LYMPHSABS 1.9 12/30/2016 1254   MONOABS 0.8 12/30/2016 1254   EOSABS 0.2 12/30/2016 1254   BASOSABS 0.1 12/30/2016 1254    BMET    Component Value Date/Time   NA 130 (L) 12/30/2016 1254   K 4.7 12/30/2016 1254   CL 103 12/30/2016 1254   CO2 17 (L) 12/30/2016 1254   GLUCOSE 163 (H) 12/30/2016 1254   BUN 29 (H) 12/30/2016 1254   CREATININE 3.51 (H) 12/30/2016 1254   CALCIUM 7.7 (L) 12/30/2016 1254   GFRNONAA 14 (L) 12/30/2016 1254   GFRAA 16 (L) 12/30/2016 1254    Cardiac Arrest in setting of ?Hypoxic Respiratory Failure, Septic Shock, PE, vs Cardiogenic Shock  -Patient found without oxygen in setting of bilateral PNA, h/o recent cardiac arrest P: -CXR and ABG  -Obtain ECHO  -Repeat Labs -Trend Trop  -VAS US LE  > unable to get CT Chest with Contrast due to kidney function  -Scheduled Duoneb  -Maintain Oxygen >92 (Currently on NRB may need to be intubated, mental status is back to baseline, no  distress) - will reassess in one hour  Acute on Chronic Kidney Diease  -Bicarb 14 P:  -Trend BMP  -Bicarb gtt @ 7175ml/hr > D/C NS Gtt -May need Nephrology consult > Currently still making urine with no electrolyte imbalances   CC Time: 62 minutes   Cynthia KussmaulKatalina Dillon, AGACNP-BC [facility]  Pgr: [phone]  PCCM Pgr: [phone]   51 year old with uncontrolled diabetes and peripheral arterial disease admitted with HCAP, [facility], requiring nonrebreather due to severe hypoxemia, returned from CT scan and sustained cardiac arrest due to severe hypoxia being off oxygen, 6 minutes downtime with ROSC I examined her around 11:30 PM about 4 hours after transfer to ICU. She is tolerating nonrebreather with 1. (Able to talk in full sentences respiratory rate about 24, bilateral scattered crackles, no accessory muscle use Reviewed CT scan that shows bilateral lower lobe consolidation  Discussed with family, we'll continue nonrebreather for now, family member to stay at bedside His face mask seems to come off, then we can transition to BiPAP overnight Continue broad-spectrum antibiotics-imipenem and vancomycin for HCAP   Additional critical care time x 35mins  Cynthia MilchALVA,Cynthia Zee V. MD

## 2016-12-30 NOTE — H&P (Signed)
History and Physical    Cynthia Dillon ZOX:[id number] DOB: [birth year] DOA: 12/30/2016  PCP: System, Pcp Not In (Confirm with patient/family/NH records and if not entered, [facility]) [facility]  I have personally briefly reviewed patient's [facility] as well as [facility] we received in transfer.  Chief Complaint: Patient is a 51 year old female [facility] due to pneumonia, increased oxygen requirement and persistent fever.  HPI: Cynthia Dillon is a 51 y.o. 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 [facility] with complaints of nausea and vomiting and was found to have increased oxygen requirement. She felt mildly short of breath but does use nocturnal home oxygen for an unknown reason. CT scan showed pneumonia and she was admitted into the hospital initially using Levaquin and oxacillin. Unfortunately her clinical status worsened she required more oxygen and started having fevers. Antibiotics were changed to vancomycin and Zosyn. She did continue to have fevers throughout the next 24 hours and could not be taken off of her 100% nonrebreather. Her creatinine worsened and she developed acute on chronic renal failure with a creatinine as high as 3.02. She was switched to imipenem today and given her worsening clinical status she was accepted in [facility] infectious diseases and pulmonary with possible renal evaluation. [facility].   Review of Systems: As per HPI otherwise 10 point review of systems negative.  Unacceptable ROS statements: "10 systems reviewed," "Extensive" (without elaboration).  Acceptable  ROS statements: "All others negative," "All others reviewed and are negative," and "All others unremarkable," with at LEAST ONE ROS documented Can't double dip - if using for HPI can't use for ROS Review of Systems  Constitutional: Positive for chills and fever.  HENT: Negative for ear discharge, ear pain and nosebleeds.   Eyes: Negative for blurred vision, double vision and photophobia.  Respiratory: Positive for shortness of breath. Negative for cough, hemoptysis and sputum production.   Cardiovascular: Negative for chest pain, palpitations and orthopnea.  Gastrointestinal: Negative for heartburn, nausea and vomiting.  Genitourinary: Negative for dysuria, frequency and urgency.  Musculoskeletal: Negative for back pain, myalgias and neck pain.  Skin:       Nonhealing ulceration of the left foot stump in area of previous surgery.  Neurological: Positive for weakness. Negative for dizziness, tingling and headaches.  Endo/Heme/Allergies: Negative for environmental allergies. Does not bruise/bleed easily.  Psychiatric/Behavioral: Negative for depression, substance abuse and suicidal ideas.   Past Medical History:  Diagnosis Date  . Anemia   . Chronic kidney disease    "problems peeing" (12/30/2016)  . Depression   . Diabetes (HCC)    "dx'd age 51 when I had my 1st baby; always took shots when I was pregnant; didn't take them between pregnancies; started taking shots again ~ 3 yr ago"  . Heart murmur   . History of blood transfusion    "blood count was low" (12/30/2016)  . Hypertension   . Migraine    "~ q 3 days" (12/30/2016)  . Myocardial infarction [facility](HCC) ~ 10[facility]2016   [facility]  . On home oxygen therapy    "2-3L prn; aien't wore it in ~ 3 months" (12/30/2016)  . Pneumonia    "  now & 1-2 times before" (12/30/2016)  . Short-term memory loss    "short term memory loss for the last year" (12/30/2016)    Past Surgical History:  Procedure Laterality Date  . CHOLECYSTECTOMY OPEN      . FOOT AMPUTATION THROUGH METATARSAL Left 02/15/2014     reports that she has never smoked. She has quit using smokeless tobacco. She reports that she does not drink alcohol or use drugs.  Allergies  Allergen Reactions  . Latex Rash    Family history significant for father with diabetes. Her mother and all of her children are in good health.  Social history she doesn't smoke drink or use any recreational drugs. She has never been a smoker. She lives at home.  Prior to Admission medications   Medication Sig Start Date End Date Taking? Authorizing Provider  gabapentin (NEURONTIN) 600 MG tablet Take 1,200 mg by mouth 3 (three) times daily.    [provider]  insulin NPH-regular Human (NOVOLIN 70/30) (70-30) 100 UNIT/ML injection Inject 10 Units into the skin.    [provider]  levothyroxine (SYNTHROID, LEVOTHROID) 25 MCG tablet Take 25 mcg by mouth daily before breakfast.    [provider]  sertraline (ZOLOFT) 50 MG tablet Take 50 mg by mouth daily.    [provider]  traMADol (ULTRAM) 50 MG tablet Take 50 mg by mouth every 8 (eight) hours as needed.    [provider]  vancomycin IVPB Inject 1,500 mg into the vein every other day.    [provider]    Physical Exam: Vitals:   12/30/16 1100  BP: (!) 96/59  Pulse: 88  Resp: 20  Temp: 98.4 F (36.9 C)  TempSrc: Oral  SpO2: 97%  Weight: 85.8 kg (189 lb 2.5 oz)  Height: 5\' 4"  (1.626 m)    Constitutional: NAD, calm, comfortable Vitals:   12/30/16 1100  BP: (!) 96/59  Pulse: 88  Resp: 20  Temp: 98.4 F (36.9 C)  TempSrc: Oral  SpO2: 97%  Weight: 85.8 kg (189 lb 2.5 oz)  Height: 5\' 4"  (1.626 m)   Eyes: PERRL, lids and conjunctivae normal ENMT: Mucous membranes are moist. Posterior pharynx clear of any exudate or lesions.Normal dentition.  Neck: normal, supple, no masses, no thyromegaly Respiratory: Coarse breath sounds bilaterally, no wheezing, no crackles.  Increased respiratory effort. No accessory muscle use.  Cardiovascular: Regular rate and rhythm, no murmurs / rubs / gallops. No extremity edema. 2+ pedal pulses. No carotid bruits.  Abdomen: no tenderness, no masses palpated. No hepatosplenomegaly. Bowel sounds positive.  Musculoskeletal: no clubbing / cyanosis. No joint deformity upper and lower extremities. Good ROM, no contractures. Normal muscle tone.  Skin: no rashes, lesions. No induration.  2 ulcerations noted on the stump of the left foot with one being 1.5 cm the other one being 1 cm in diameter. There is no drainage. Neurologic: CN 2-12 grossly intact. Sensation intact, DTR normal. Strength 5/5 in all 4.  Psychiatric: Normal judgment and insight. Alert and oriented x 3. Normal mood.    Labs on Admission: I have personally reviewed following labs and imaging studies From [facility] at [facility]: CBC: White blood cell count 7.8, hemoglobin 7.5, hematocrit 22.3. MCV 84 platelet count 317 Basic Metabolic Panel:  Sodium 134 carbon dioxide 19 potassium 4.7 chloride 102 anion gap 18 BUN 31 creatinine 3.20 estimate GFR 15 GFR: CrCl cannot be calculated (No order found.). Liver Function Tests: Total bilirubin 0.4 AST 17 ALT 24 alkaline  phosphatase 111 total protein 7.3 Alvin 3.6 this was obtained on 12/27/2016 on admission  Cardiac Enzymes: Ruled out by serial enzymes on admission to [facility] on the 15th.  Coagulation profile PT 9.5 INR 0.9 PTT 29.4  BNP (last 3 results) ;  11,200  CBG: 158, 149, 258, 214, 185.   Lipid Profile: No results for input(s): CHOL, HDL, LDLCALC, TRIG, CHOLHDL, LDLDIRECT in the last 72 hours. Thyroid Function Tests: No results for input(s): TSH, T4TOTAL, FREET4, T3FREE, THYROIDAB in the last 72 hours. Anemia Panel: Iron 19, rertic count 1.8, TIBC 262, % sat 7.2, ferritin 4, vitamin B12 239, serum folate 15.5.   Radiological Exams on Admission: CT scan obtained on 12/27/2016 revealed  pneumonia repeat chest x-ray since that time has shown persistent basilar atelectasis and infiltrates. EKG: Independently reviewed. Normal sinus rhythm  Assessment/Plan Principal Problem:   Respiratory failure, acute (HCC) Active Problems:   Lobar pneumonia (HCC)   Acute renal failure superimposed on stage 3 chronic kidney disease (HCC)   DM (diabetes mellitus), type 2 with peripheral vascular complications (HCC)   Diabetic foot ulcer associated with diabetes mellitus due to underlying condition (HCC)  1. Acute respiratory failure:  Patient will be admitted into the hospital and continued on her 100% nonrebreather. I have consult pulmonary and ordered a chest x-ray for further evaluation. Etiology of acute hypoxemia is very complicated at this point given the patient history of pneumonia and presentation to outside hospital with a BNP of 11,000. Will evaluate chest x-ray and consider adding some diuresis as well as modifying antibiotics per pulmonary and ID recommendations.  #2 lobar pneumonia patient will be treated with imipenem as per transfer orders from outside hospital. Infectious diseases has been consult id. Will await further evaluation by them. We'll continue supportive care with oxygen and nebulizer treatments.  3. Renal failure superimposed on stage III chronic kidney disease creatinine has increased from the 2 range up to 3.2 on this admission. This is of course very concerning. We'll avoid nephrotoxic agents. Vancomycin has been discontinued. Will monitor renal function closely.  4. Diabetes mellitus type 2 with peripheral vascular complications: Will check fingerstick blood glucoses before meals and at bedtime start her on moderate dose sliding scale as well as bedtime insulin coverage sugars have been running high at the outside hospital. [facility] also continue with standing daily insulin dosing.  5. Diabetic foot ulcer associated with diabetes mellitus due to underlying condition:  We'll ask wound care to see patient in the wound base looks clean is no erythema or edema this appears very chronic in nature. Patient states it has been present for 3 years. I am unsure as to whether or not any acute treatment would appreciably change the natural history of this ulcer. Tinea dressing changes until seen by wound care.  DVT prophylaxis: Heparin Code Status: Full code Family Communication: Patient awake alert and oriented no family present at the time of admission. Disposition Plan: Likely home at discharge Consults called: Pulmonary: Dr Katina Degree, ID: Dr Ilsa Iha Admission status: inpatient  I certify this patient will need a 2 day hospital stay due to her persistent and acute respiratory failure and need for further evaluation and management including multiple consultations.   Lahoma Crocker MD FACP [facility] Pager [phone]  If 7PM-7AM, please contact night-coverage [email or web address] Password [facility]  12/30/2016, 12:51 PM

## 2016-12-30 NOTE — Care Management Note (Signed)
Case Management Note  Patient Details  Name: Cynthia Dillon MRN: [id number] Date of Birth: 1965/11/28  Subjective/Objective:  Pt admitted with Resp issues - requiring non rebreather                   Action/Plan:   Pt is from home - unable to assess due to current resp issues.  CM and CSW consulted for Multiple Medical Problems.  CM will need to follow up on insurance, PCP and prescription coverage once medically stable    Expected Discharge Date:                  Expected Discharge Plan:     In-House Referral:  Clinical Social Work  Discharge planning Services  CM Consult  Post Acute Care Choice:    Choice offered to:     DME Arranged:    DME Agency:     HH Arranged:    HH Agency:     Status of Service:     If discussed at [employer] Meetings, dates discussed:    Additional Comments:  [name], [name], RN 12/30/2016, 12:00 PM

## 2016-12-30 NOTE — Progress Notes (Signed)
eLink Physician-Brief Progress Note Patient Name: Cynthia DenseShiela Ann Martin Dillon DOB: Oct 07, 1965 MRN: [id number]   Date of Service  12/30/2016  HPI/Events of Note  Called by pharmacy d/t 0.9 NaCl + 150 meq/L NaHCO3 ordered IV. This solution will be hypertonic.   eICU Interventions  Will change order to 150 meq/L NaHCO3 in sterile water.     Intervention Category Major Interventions: Acid-Base disturbance - evaluation and management  Eura Radabaugh Eugene 12/30/2016, 8:17 PM

## 2016-12-30 NOTE — Consult Note (Signed)
[facility] for Infectious Disease  Total days of antibiotics 4        Imipenem 5/18        Vanc/Zosyn (5/16 - 5/18)        Levaquin/Oxacillin (5/15 - 5/16)       Reason for Consult: Unresolving PNA    Referring Physician: Evangeline Gula  Principal Problem:   Respiratory failure, acute ([place]) Active Problems:   DM (diabetes mellitus), type 2 with peripheral vascular complications ([place])   Diabetic foot ulcer associated with diabetes mellitus due to underlying condition ([place])   Lobar pneumonia ([place])   Acute renal failure superimposed on stage 3 chronic kidney disease ([place])    HPI: Cynthia Dillon is a 51 y.o. female that was transferred to [facility] from [facility] where she was being managed for pneumonia. She was initially admitted to the hospital on 12/27/16 with weakness, nausea/vomiting/watery diarrhea x 3 days - CT scan was obtained showing pneumonia and was started on levaquin and oxacillin. Her clinical status continued to worsen where she required more oxygen and persisted with fevers. Broadened to Vancomycin and Zosyn. Developed A/C renal failure with creatinine > 3 and switched to imipenem. BNP elevated at [place] at 11,000; echo done per review of [place] records "in 2018 with normal EF". Of note she normally requires oxygen at night.   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 Also reports having had an MI in March but uncertain of the details.    Past Medical History:  Diagnosis Date  . Anemia   . Chronic kidney disease    "problems peeing" (12/30/2016)  . Depression   . Diabetes ([place])    "dx'd age 23 when I had my 1st baby; always took shots when I was pregnant; didn't take them between pregnancies; started taking shots again ~ 3 yr ago"  . Heart murmur   . History of blood transfusion    "blood count was low" (12/30/2016)  . Hypertension   .  Migraine    "~ q 3 days" (12/30/2016)  . Myocardial infarction [facility]) ~ 10/2016   "[facility]"  . On home oxygen therapy    "2-3L prn; aien't wore it in ~ 3 months" (12/30/2016)  . Pneumonia    "now & 1-2 times before" (12/30/2016)  . Short-term memory loss    "short term memory loss for the last year" (12/30/2016)    Allergies:  Allergies  Allergen Reactions  . Latex Rash    Current antibiotics: Anti-infectives    Start     Dose/Rate Route Frequency Ordered Stop   12/30/16 2000  vancomycin (VANCOCIN) IVPB 1000 mg/200 mL premix  Status:  Discontinued     1,000 mg 200 mL/hr over 60 Minutes Intravenous Every 48 hours 12/30/16 1536 12/30/16 1611   12/30/16 1500  imipenem-cilastatin (PRIMAXIN) 500 mg in sodium chloride 0.9 % 100 mL IVPB     500 mg 200 mL/hr over 30 Minutes Intravenous Every 12 hours 12/30/16 1423     12/30/16 1300  imipenem-cilastatin (PRIMAXIN) 1,000 mg in sodium chloride 0.9 % 250 mL IVPB  Status:  Discontinued     1,000 mg 250 mL/hr over 60 Minutes Intravenous  Once 12/30/16 1207 12/30/16 1352      MEDICATIONS: . collagenase   Topical Daily  . gabapentin  1,200 mg Oral TID  . heparin  5,000  Units Subcutaneous Q8H  . insulin aspart  0-15 Units Subcutaneous TID WC  . insulin aspart  0-5 Units Subcutaneous QHS  . insulin aspart  4 Units Subcutaneous TID WC  . [START ON 12/31/2016] levothyroxine  25 mcg Oral QAC breakfast  . multivitamin with minerals  1 tablet Oral Daily  . nutrition supplement (JUVEN)  1 packet Oral BID BM  . sertraline  50 mg Oral Daily  . sodium chloride flush  3 mL Intravenous Q12H  . sodium chloride flush  3 mL Intravenous Q12H    Social History  Substance Use Topics  . Smoking status: Never Smoker  . Smokeless tobacco: Former Systems developer  . Alcohol use No    History reviewed. No pertinent family history.  Review of Systems - History obtained from chart review and the patient General ROS: positive for - chills and fever; negative  for - malaise, weight gain or weight loss ENT ROS: negative for - headaches, nasal congestion, nasal discharge, sinus pain or sore throat Hematological and Lymphatic ROS: positive for - pallor negative for - bleeding problems or swollen lymph nodes Respiratory ROS: positive for - pleuritic pain and shortness of breath negative for - cough, hemoptysis or wheezing Cardiovascular ROS: positive for - chest pain, dyspnea on exertion, murmur; negative for - edema, loss of consciousness or palpitations Gastrointestinal ROS: positive for - abdominal pain, diarrhea and nausea/vomiting negative for - blood in stools, hematemesis or melena Genito-Urinary ROS: no dysuria, trouble voiding, or hematuria Neurological ROS: no TIA or stroke symptoms reports short term memory loss at baseline Dermatological ROS: negative for rashes   OBJECTIVE: Temp:  [98.4 F (36.9 C)-100.2 F (37.9 C)] 99.4 F (37.4 C) (05/18 1522) Pulse Rate:  [85-88] 85 (05/18 1256) Resp:  [20] 20 (05/18 1256) BP: (96-102)/(59-64) 102/64 (05/18 1256) SpO2:  [93 %-97 %] 93 % (05/18 1256) Weight:  [189 lb 2.5 oz (85.8 kg)] 189 lb 2.5 oz (85.8 kg) (05/18 1100)   General appearance: alert, no distress. Difficult to follow at times. Uncertain of chronology of illnesses. Pleasant and does not appear fatigued with movemement. Resp: bibasilar rales R > L Cardio: regular rate and rhythm, S1, S2 normal and with systolic murmur  Extremities: extremities normal, atraumatic, no cyanosis or edema and no edema, redness or tenderness in the calves or thighs Pulses: 2+ RLE, 2+ b/l radial  Skin: Skin color, texture, turgor normal. No rashes or lesions Neurologic: Alert and oriented X 3, normal strength and tone. Normal symmetric reflexes. Normal coordination and gait Incision/Wound:  LABS: Results for orders placed or performed during the hospital encounter of 12/30/16 (from the past 48 hour(s))  Culture, blood (routine x 2) Call MD if unable  to obtain prior to antibiotics being given     Status: None (Preliminary result)   Collection Time: 12/30/16 12:54 PM  Result Value Ref Range   Specimen Description BLOOD RIGHT ARM    Special Requests IN PEDIATRIC BOTTLE Blood Culture adequate volume    Culture PENDING    Report Status PENDING   Culture, blood (routine x 2) Call MD if unable to obtain prior to antibiotics being given     Status: None (Preliminary result)   Collection Time: 12/30/16 12:54 PM  Result Value Ref Range   Specimen Description BLOOD RIGHT HAND    Special Requests IN PEDIATRIC BOTTLE Blood Culture adequate volume    Culture PENDING    Report Status PENDING   Prealbumin     Status: Abnormal  Collection Time: 12/30/16 12:54 PM  Result Value Ref Range   Prealbumin 11.5 (L) 18 - 38 mg/dL  Comprehensive metabolic panel     Status: Abnormal   Collection Time: 12/30/16 12:54 PM  Result Value Ref Range   Sodium 130 (L) 135 - 145 mmol/L   Potassium 4.7 3.5 - 5.1 mmol/L   Chloride 103 101 - 111 mmol/L   CO2 17 (L) 22 - 32 mmol/L   Glucose, Bld 163 (H) 65 - 99 mg/dL   BUN 29 (H) 6 - 20 mg/dL   Creatinine, Ser 3.51 (H) 0.44 - 1.00 mg/dL   Calcium 7.7 (L) 8.9 - 10.3 mg/dL   Total Protein 6.7 6.5 - 8.1 g/dL   Albumin 2.2 (L) 3.5 - 5.0 g/dL   AST 23 15 - 41 U/L   ALT 19 14 - 54 U/L   Alkaline Phosphatase 154 (H) 38 - 126 U/L   Total Bilirubin 0.6 0.3 - 1.2 mg/dL   GFR calc non Af Amer 14 (L) >60 mL/min   GFR calc Af Amer 16 (L) >60 mL/min    Comment: (NOTE) The eGFR has been calculated using the CKD EPI equation. This calculation has not been validated in all clinical situations. eGFR's persistently <60 mL/min signify possible Chronic Kidney Disease.    Anion gap 10 5 - 15  Magnesium     Status: None   Collection Time: 12/30/16 12:54 PM  Result Value Ref Range   Magnesium 1.7 1.7 - 2.4 mg/dL  CBC WITH DIFFERENTIAL     Status: Abnormal   Collection Time: 12/30/16 12:54 PM  Result Value Ref Range   WBC 8.6  4.0 - 10.5 K/uL   RBC 2.76 (L) 3.87 - 5.11 MIL/uL   Hemoglobin 7.1 (L) 12.0 - 15.0 g/dL   HCT 23.1 (L) 36.0 - 46.0 %   MCV 83.7 78.0 - 100.0 fL   MCH 25.7 (L) 26.0 - 34.0 pg   MCHC 30.7 30.0 - 36.0 g/dL   RDW 16.4 (H) 11.5 - 15.5 %   Platelets 362 150 - 400 K/uL   Neutrophils Relative % 65 %   Neutro Abs 5.6 1.7 - 7.7 K/uL   Lymphocytes Relative 22 %   Lymphs Abs 1.9 0.7 - 4.0 K/uL   Monocytes Relative 9 %   Monocytes Absolute 0.8 0.1 - 1.0 K/uL   Eosinophils Relative 3 %   Eosinophils Absolute 0.2 0.0 - 0.7 K/uL   Basophils Relative 1 %   Basophils Absolute 0.1 0.0 - 0.1 K/uL  APTT     Status: None   Collection Time: 12/30/16 12:54 PM  Result Value Ref Range   aPTT 36 24 - 36 seconds  Protime-INR     Status: None   Collection Time: 12/30/16 12:54 PM  Result Value Ref Range   Prothrombin Time 14.9 11.4 - 15.2 seconds   INR 1.17   TSH     Status: None   Collection Time: 12/30/16 12:54 PM  Result Value Ref Range   TSH 2.946 0.350 - 4.500 uIU/mL    Comment: Performed by a 3rd Generation assay with a functional sensitivity of <=0.01 uIU/mL.  Procalcitonin - Baseline     Status: None   Collection Time: 12/30/16 12:54 PM  Result Value Ref Range   Procalcitonin 0.79 ng/mL    Comment:        Interpretation: PCT > 0.5 ng/mL and <= 2 ng/mL: Systemic infection (sepsis) is possible, but other conditions are known to elevate  PCT as well. (NOTE)         ICU PCT Algorithm               Non ICU PCT Algorithm    ----------------------------     ------------------------------         PCT < 0.25 ng/mL                 PCT < 0.1 ng/mL     Stopping of antibiotics            Stopping of antibiotics       strongly encouraged.               strongly encouraged.    ----------------------------     ------------------------------       PCT level decrease by               PCT < 0.25 ng/mL       >= 80% from peak PCT       OR PCT 0.25 - 0.5 ng/mL          Stopping of antibiotics                                              encouraged.     Stopping of antibiotics           encouraged.    ----------------------------     ------------------------------       PCT level decrease by              PCT >= 0.25 ng/mL       < 80% from peak PCT        AND PCT >= 0.5 ng/mL             Continuing antibiotics                                              encouraged.       Continuing antibiotics            encouraged.    ----------------------------     ------------------------------     PCT level increase compared          PCT > 0.5 ng/mL         with peak PCT AND          PCT >= 0.5 ng/mL             Escalation of antibiotics                                          strongly encouraged.      Escalation of antibiotics        strongly encouraged.   Glucose, capillary     Status: Abnormal   Collection Time: 12/30/16 12:59 PM  Result Value Ref Range   Glucose-Capillary 159 (H) 65 - 99 mg/dL  Blood gas, arterial     Status: Abnormal   Collection Time: 12/30/16  1:00 PM  Result Value Ref Range   FIO2 80.00    Delivery systems OXYGEN MASK    pH, Arterial 7.285 (L) 7.350 - 7.450  pCO2 arterial 40.6 32.0 - 48.0 mmHg   pO2, Arterial 87.4 83.0 - 108.0 mmHg   Bicarbonate 18.7 (L) 20.0 - 28.0 mmol/L   Acid-base deficit 6.8 (H) 0.0 - 2.0 mmol/L   O2 Saturation 96.2 %   Patient temperature 98.6    Collection site LEFT RADIAL    Drawn by [id number]    Sample type ARTERIAL DRAW    Allens test (pass/fail) PASS PASS  Lactic acid, plasma     Status: None   Collection Time: 12/30/16  1:02 PM  Result Value Ref Range   Lactic Acid, Venous 0.8 0.5 - 1.9 mmol/L  Vancomycin, random     Status: None   Collection Time: 12/30/16  2:34 PM  Result Value Ref Range   Vancomycin Rm 17     Comment:        Random Vancomycin therapeutic range is dependent on dosage and time of specimen collection. A peak range is 20.0-40.0 ug/mL A trough range is 5.0-15.0 ug/mL            MICRO: Results for orders placed  or performed during the hospital encounter of 12/30/16  Culture, blood (routine x 2) Call MD if unable to obtain prior to antibiotics being given     Status: None (Preliminary result)   Collection Time: 12/30/16 12:54 PM  Result Value Ref Range Status   Specimen Description BLOOD RIGHT ARM  Final   Special Requests IN PEDIATRIC BOTTLE Blood Culture adequate volume  Final   Culture PENDING  Incomplete   Report Status PENDING  Incomplete  Culture, blood (routine x 2) Call MD if unable to obtain prior to antibiotics being given     Status: None (Preliminary result)   Collection Time: 12/30/16 12:54 PM  Result Value Ref Range Status   Specimen Description BLOOD RIGHT HAND  Final   Special Requests IN PEDIATRIC BOTTLE Blood Culture adequate volume  Final   Culture PENDING  Incomplete   Report Status PENDING  Incomplete    IMAGING: Dg Chest Port 1 View  Result Date: 12/30/2016 CLINICAL DATA:  Respiratory failure for 4 days. EXAM: PORTABLE CHEST 1 VIEW COMPARISON:  None. FINDINGS: The cardiomediastinal silhouette is unremarkable. Pulmonary vascular congestion and possible interstitial edema noted. Bilateral opacities within the mid and lower lungs bilaterally noted with trace bilateral pleural effusions. There is no evidence of pneumothorax. No acute bony abnormalities are identified. IMPRESSION: Bilateral lower lung opacities which may represent airspace disease/edema and/or atelectasis. Trace bilateral pleural effusions. Pulmonary vascular congestion and possible interstitial pulmonary edema. Electronically Signed   By: Margarette Canada M.D.   On: 12/30/2016 13:29    HISTORICAL MICRO/IMAGING  Assessment/Plan:    1. Pneumonia  -No leukocytosis, Tmax 100.2 -Random Vancomycin level drawn today and is still therapeutic at 17. With her kidney function continuing to rise would not add back Vancomycin at this time.  -High risk for atypical pathogens - will cover with azithromycin and de-escalate  imipenem to zosyn for HCAP. Check urine legionella.  -pro-BNP elevated at 11,000 at [facility] with bilateral effusions noted on xray. Per records received IV lasix x 1 prior to transfer. Seems that she improved after receiving a dose of diuretic. Would consider repeating this to see if she improves clinically. She does have a systolic murmur on exam. Echo?  2. Renal Failure, acute on chronic  -creatinine continues to worsen. Presenting creatinine 2.3 at [facility], now 3.5 today -continue to monitor   3. Diabetes with Left plantar ulcer  -recently  treated at [place] following  4. Anemia -Hgb 7.1 today. Defer to primary team.   Janene Madeira, MSN, NP-C [facility] [place] Cell: [phone] Pager: [phone]  12/30/2016 3:48 PM

## 2016-12-30 NOTE — Progress Notes (Addendum)
Pharmacy Antibiotic Note  Cynthia Dillon is a 51 y.o. female transferred from [facility] with refractory pneumonia. She was started on levaquin there, then switched to vancomycin and zosyn as respiratory status worsened.   She received 1500mg  vancomycin on 5/16 @ 2230 and then vancomycin was subsequently discontinued as renal function worsened (sCr 2.6 > 3.2). Vancomycin random level drawn this afternoon is in therapeutic range at 3917mcg/mL. SCr 3.5, LA 0.8, PCT 0.79.  ID has now seen and will de-escalate Imipenem to Zosyn, discontinue Vancomycin, and start azithromycin coverage for atypicals.  Plan: - Zosyn 2.25g IV q8h as anticipate renal function to continue to worsen - Azithromycin 500mg  IV x1, then 250mg  IV q24h per MD - Follow clinical progression, renal function, c/s  Height: 5\' 4"  (162.6 cm) Weight: 189 lb 2.5 oz (85.8 kg) IBW/kg (Calculated) : 54.7  Temp (24hrs), Avg:99.3 F (37.4 C), Min:98.4 F (36.9 C), Max:100.2 F (37.9 C)   Recent Labs Lab 12/30/16 1254 12/30/16 1302 12/30/16 1434  WBC 8.6  --   --   CREATININE 3.51*  --   --   LATICACIDVEN  --  0.8  --   VANCORANDOM  --   --  17    Estimated Creatinine Clearance: 20.3 mL/min (A) (by C-G formula based on SCr of 3.51 mg/dL (H)).    Allergies  Allergen Reactions  . Latex Rash    Antimicrobials this admission: 5/15 Levaquin ([facility]) >> 5/16 5/16 Vanc x 1 ([facility]) >> 5/18 5/16 Zosyn ([facility]) >> 5/18 Primaxin x1 5/18 Azithromycin >>  Dose adjustments this admission: 5/18: vancomycin 1g IV q48h ordered in response to VR of 1717mcg/mL  Microbiology results: 5/18 blood x2 >>  Thank you for allowing pharmacy to be a part of this patient's care.  Tru Leopard D. Tilla Wilborn, PharmD, BCPS Clinical Pharmacist Pager: [phone] 12/30/2016 3:35 PM

## 2016-12-30 NOTE — Consult Note (Addendum)
WOC Nurse wound consult note Reason for Consult: Consult requested for left stump wounds.  Pt states these are chronic and she is planning to be [facility] skin [facility]. Wound type: 2 areas of full thickness wounds to left plantar stump Measurement: 1.5X1.5X.2cm and 2X1.5X.2cm Wound bed: Outer wound 80% red, 20% yellow, inner wound 70% yellow, 30% red.  Both are surrounded by dry calloused yellow skin. Drainage (amount, consistency, odor) Small amt yellow drainage, no odor Dressing procedure/placement/frequency: Santyl for enzymatic debridement of nonviable tissue.  Discussed plan of care with patient and she verbalized understanding. She can resume follow-up with physician after discharge as planned. Please re-consult if further assistance is needed.  Thank-you,  Cammie Mcgeeawn Asees Manfredi MSN, RN, CWOCN, [place]-AP, CNS [phone]

## 2016-12-30 NOTE — Progress Notes (Signed)
Pharmacy Antibiotic Note  Cynthia Dillon is a 51 y.o. female transferred from [facility] with refractory pneumonia. She was started on levaquin there, then switched to vancomycin and zosyn as respiratory status worsened. She received 1500mg  vancomycin on 5/16 @ 2230 and then vancomycin was subsequently discontinued as renal function worsened (sCr 2.6 > 3.2). SCr continues to trend up to 3.5 with CrCl ~ 20 ml/min. With continued fevers and no improvement, the plan is to now switch her to primaxin and add back vancomycin.  Vancomycin trough goal 15-20  Plan: 1) Check random vancomycin level and re-dose as approriate 2) Primaxin 500mg  IV q12  Height: 5\' 4"  (162.6 cm) Weight: 189 lb 2.5 oz (85.8 kg) IBW/kg (Calculated) : 54.7  Temp (24hrs), Avg:99.3 F (37.4 C), Min:98.4 F (36.9 C), Max:100.2 F (37.9 C)   Recent Labs Lab 12/30/16 1254 12/30/16 1302  WBC 8.6  --   CREATININE 3.51*  --   LATICACIDVEN  --  0.8    Estimated Creatinine Clearance: 20.3 mL/min (A) (by C-G formula based on SCr of 3.51 mg/dL (H)).    Allergies  Allergen Reactions  . Latex Rash    Antimicrobials this admission: 5/15 Levaquin ([facility]) >> 5/16 5/16 Vanc x 1 ([facility]), resume 5/18 >> 5/16 Zosyn ([facility]) >> 5/18 5/18 Primaxin>>  Dose adjustments this admission: n/a  Microbiology results: 5/18 blood x2 >>  Thank you for allowing pharmacy to be a part of this patient's care.  Fredrik RiggerMarkle, Antonino Nienhuis Sue 12/30/2016 2:24 PM

## 2016-12-30 NOTE — Code Documentation (Signed)
Patient went to CT @ 1805 had orders to transport off monitor was taken down to CT by 2 transporters with NRB on @ 15L. patient AxO x 4 VSS at time of transport. ct report shows exam ended @1826 . patient was brought back to room by transporters, no Diplomatic Services operational officersecretary at desk and transporters did not notify anybody patient was returned to her room. Ginger RN was making rounds checking on other patients and noticed patient was laying across end of bed on her back. went into check on patient NRB mask was not on patient and also patient was not on monitor due to the fact we didn't know she was in the room to hook her back up to monitor. patient was not breathing nonresponsive and we were unable to palpate pulse. code blue called staff responded cpr and ambubag was started for 3 minutes code cart in room hooked up to zoll  cardiac rhythm was showing PEA. CPR continued patient heart rate and respirations returned. VS 150/76 HR 114 O2 sat 78% prior to putting back on NRB after on NRB patient came up to 91%. orders given to move patient to 2M08 [facility]. patient was transported by MD, nurses and RT to ICU.   Sefora Tietje, Kae HellerMiranda Lynn, Chief of StaffN [place],Ginger RN

## 2016-12-30 NOTE — Progress Notes (Signed)
Initial Nutrition Assessment  DOCUMENTATION CODES:   Obesity unspecified  INTERVENTION:   -Juven BID -MVI daily  NUTRITION DIAGNOSIS:   Increased nutrient needs related to wound healing as evidenced by estimated needs.  GOAL:   Patient will meet greater than or equal to 90% of their needs  MONITOR:   PO intake, Labs, Weight trends, Skin, I & O's  REASON FOR ASSESSMENT:   Consult  (uncontrolled DM with foot ulcer)  ASSESSMENT:   51 year old female with extensive PMH primarily uncontrolled diabetes for decades who has had an amputation of the left leg as well as vision loss who presented with SOB starting on Tuesday 8/15 and a CT was done that showed pneumonia and was started on broad spectrum abx that was changed during the four day course x2 for "lack of response"  After 4 days of abx the patient was transferred to [facility] for ID and pulmonary to see due to ongoing hypoxemia and lack of response to treatment.   Pt admitted with hypoxemia.   Spoke with pt, who was on non-rebreather mask at time of visit. She reports intake has been fair over the past week, due to hospitalization. She shares she has been consuming mostly fruit cups because vegetables, particualrly salad green "turn my stomach up". She reports eating the chicken tender off her slad for lunch. Pt typically consumes 3 meals PTA- Breakfast: mac and cheese, Lunch: pinto beans and fried bread, and dinner: chicken and rice. She claims she follows a DM diet at home, however, it was difficult to tell from pt response if she was making a joke.   Pt denies recent wt loss. Noted wt of 189#.   Pt shares that she was diagnosed with DM at age 87 and reports "my sugars are always high". She does not recall what her last Hgb A1c was. Her PCP is [facility] in [place] and she reports she takes 70/30 insulin- medications have not been adjusted recently per her report. Pt shares that she was just approved for medicaid and is  hopeful that she can find a PCP closer to her home in [place]. Per her report, she checks her CBGS 3 times daily and readings range between 118-180#. According to pt, she has had a DM ulcer on her lt foot for the past 3-4 years, however, claims no one has addressed it- she is hopeful to get a skin graft. She denies taking any supplements, vitamins, or receiving any type of wound care. She is agreeable to take a MVI. Discussed importance of good meal and supplement intake to aid in wound healing.   Nutrition-Focused physical exam completed. Findings are no fat depletion, no muscle depletion, and no edema.   Labs reviewed: CBGS: 159.   Diet Order:  Diet Carb Modified Fluid consistency: Thin; Room service appropriate? Yes  Skin:  Wound (see comment) (lt foot DM ulcer)  Last BM:  12/29/16  Height:   Ht Readings from Last 1 Encounters:  12/30/16 5' 4"  (1.626 m)    Weight:   Wt Readings from Last 1 Encounters:  12/30/16 189 lb 2.5 oz (85.8 kg)    Ideal Body Weight:  54.5 kg  BMI:  Body mass index is 32.47 kg/m.  Estimated Nutritional Needs:   Kcal:  1650-1850  Protein:  80-95 grams  Fluid:  >1.6 L  EDUCATION NEEDS:   Education needs addressed  Victora Irby A. Jimmye Norman, RD, LDN, CDE Pager: [phone] After hours Pager: [phone]

## 2016-12-30 NOTE — Progress Notes (Signed)
CRITICAL VALUE ALERT  Critical value received:  Trop-0.04  Date of notification:  12/30/2016  Time of notification:  7:58 PM  Critical value read back:Yes.    Nurse who received alert:  Terrilyn SaverHopper, Lashandra Arauz Anderson  MD notified (1st page):  Leitha BleakKatalina, NP  Time of first page:  7:58 PM  MD notified (2nd page):  Time of second page:  Responding MD:  Leitha BleakKatalina, NP  Time MD responded:  7:58 PM

## 2016-12-30 NOTE — Progress Notes (Signed)
CODE BLUE NOTE  Patient Name: Cynthia Dillon   MRN: [id number]   Date of Birth/ Sex: [birth year] , female      Admission Date: 12/30/2016  Attending Provider: Oretha MilchAlva, Rakesh V, MD  Primary Diagnosis: Respiratory failure, acute [facility](HCC)    Indication: Pt was in her usual state of health until this PM, when she was noted to be hypoxic and pulseless. Code blue was subsequently called. At the time of arrival on scene, ACLS protocol was underway and ROSC was obtained. Internal medicine and ICU team were present on arrival. Patient's care was assumed by ICU team.    Technical Description:  - CPR performance duration:  3 minutes  - Was defibrillation or cardioversion used? Yes   - Was external pacer placed? Yes  - Was patient intubated pre/post CPR? No    Medications Administered: Y = Yes; Blank = No Amiodarone    Atropine    Calcium    Epinephrine    Lidocaine    Magnesium    Norepinephrine    Phenylephrine    Sodium bicarbonate    Vasopressin      Post CPR evaluation:  - Final Status - Was patient successfully resuscitated ? Yes - What is current rhythm? Sinus rythm - What is current hemodynamic status? Stable    Miscellaneous Information:  - Labs sent, including: None  - Primary team notified?  Yes  - Family Notified? unsure           Lovena Neighboursiallo, Mickala Laton, MD  12/30/2016, 7:46 PM

## 2016-12-30 NOTE — Consult Note (Signed)
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  She presented to [facility] Tuesday with fever 103, chills, np cough and hypoxia. Proved refractory to treatment and transferred to [facility] 5/18 for further evaluation. No labs or xrays currently available. We will continue to work up.  PAST MEDICAL HISTORY :  She  has a past medical history of Anemia; Chronic kidney disease; Depression; Diabetes ([place]); Heart murmur; History of blood transfusion; Hypertension; Migraine; Myocardial infarction ([place]) (~ 10/2016); On home oxygen therapy; and Pneumonia.  PAST SURGICAL HISTORY: She  has a past surgical history that includes Foot amputation through metatarsal (Left, ~ 2015) and Cholecystectomy open.  Allergies  Allergen Reactions  . Latex Rash    No current facility-administered medications on file prior to encounter.    Current Outpatient Prescriptions on File Prior to Encounter  Medication Sig  . gabapentin (NEURONTIN) 600 MG tablet Take 1,200 mg by mouth 3 (three) times daily.  . insulin NPH-regular Human (NOVOLIN 70/30) (70-30) 100 UNIT/ML injection Inject 10 Units into the skin.  Marland Kitchen levothyroxine (SYNTHROID, LEVOTHROID) 25 MCG tablet Take 25 mcg by mouth daily before breakfast.  . sertraline (ZOLOFT) 50 MG tablet Take 50 mg by mouth daily.  . traMADol (ULTRAM) 50 MG tablet Take 50 mg by mouth every 8 (eight) hours as needed.  . vancomycin IVPB Inject 1,500 mg into the vein every other day.    FAMILY HISTORY:  Her has no family status information on file.    SOCIAL HISTORY: She  reports that she has never smoked. She has  quit using smokeless tobacco. She reports that she does not drink alcohol or use drugs.  REVIEW OF SYSTEMS:   10 point review of system taken, please see HPI for positives and negatives.   SUBJECTIVE:  NAD  VITAL SIGNS: BP (!) 96/59 (BP Location: Right Arm)   Pulse 88   Temp 98.4 F (36.9 C) (Oral)   Resp 20   Ht _0  (1.626 m)   Wt 189 lb 2.5 oz (85.8 kg)   LMP 12/07/2016 (Approximate) Comment: irregular  SpO2 97%   BMI 32.47 kg/m   HEMODYNAMICS:    VENTILATOR SETTINGS:    INTAKE / OUTPUT: No intake/output data recorded.  PHYSICAL EXAMINATION: General: MAF NAD on 50% NRB Neuro: Appears intatct HEENT:No jvd/lan Cardiovascular:  HSR RRR Lungs:  Mild rhonchi with decreased bs bases Abdomen:  Soft +bs Musculoskeletal:  Left  Foot partial amputation with open wounds Skin:  Warm and dry  LABS:  BMET No results for input(s): NA, K, CL, CO2, BUN, CREATININE, GLUCOSE in the last 168 hours.  Electrolytes No results for input(s): CALCIUM, MG, PHOS in the last 168 hours.  CBC No results for input(s): WBC, HGB, HCT, PLT in the last 168 hours.  Coag's No results for input(s): APTT, INR in the last 168 hours.  Sepsis Markers No results for input(s): LATICACIDVEN, PROCALCITON, O2SATVEN in the last 168 hours.  ABG No results for input(s): PHART, PCO2ART, PO2ART in the last 168 hours.  Liver Enzymes No results for input(s): AST, ALT, ALKPHOS, BILITOT, ALBUMIN in the last 168 hours.  Cardiac Enzymes No results for input(s): TROPONINI, PROBNP in the last 168 hours.  Glucose No results for input(s): GLUCAP in the last 168 hours.  Imaging No results found.   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  She presented to [facility] Tuesday with fever 103, chills, np cough and hypoxia. Proved refractory to treatment and transferred to [facility] 5/18 for further evaluation. No labs or xrays currently available. We will continue to work up.  ASSESSMENT / PLAN:  PULMONARY A: Hx of bilateral lung consolidation by CT in 2016 Presented [facility] Tuesday with fever 103, non productive, chills.  Treated with abx but transferred to [facility] for refractory hypoxia 5/18 P:   CxR CT chest O2 as needed May need FOB Further work up as information obtained Abx  CARDIOVASCULAR A:  Hx of CAD P:  [place]  RENAL A:   Chronic renal insuff P:   Check creatine  GASTROINTESTINAL A:   GI protection P:   H2 blocker  HEMATOLOGIC  A:   DVT protection P:  Check  Cbc SQ heparin  INFECTIOUS A:   Presumed infection, possible pulmonary but with chronic llext wound. P:   Abx per IM Add vanc 5/18  ENDOCRINE A:   DM P:    per IM  NEUROLOGIC A:   Awake and alert but poor insight into current illness P:   RASS goal:1 Avoid sedating medications    FAMILY  - Updates: Pt updated , no family  - Inter-disciplinary family meet or Palliative Care meeting due by:  day 7    Steve Minor ACNP Maryanna Shape PCCM Pager [phone] till 3 pm If no answer page [phone] 12/30/2016, 12:32 PM  Attending Note:  51 year old female with extensive PMH primarily uncontrolled diabetes for decades who has had an amputation of the left leg as well as vision loss who presented with SOB starting on Tuesday 8/15 and a CT was done that showed pneumonia and was started on broad spectrum abx that was changed during the four day course x2 for "lack of response"  After 4 days of abx the patient was transferred to [facility] for ID and pulmonary to see due to ongoing hypoxemia and lack of response to treatment.  On exam, bibasilar crackles noted.  Unfortunately the patient is a very  poor historian and the records sent with her from [facility] have been removed from the patient's chart.  So history is spotty at best.  Patient is tolerating NRB right now with both flaps open with sat of 88-92%.  Discussed with TRH-MD  HCAP: due to multiple hospitalization within the last 3 months.             - Primaxin             - Vanc             - PCT             - F/u on cultures             - Will limit abx exposure as cultures result  Hypoxemia: can't r/o PE vs infiltrate, no imaging             -  CT of the chest with contrast to R/O PE             - Titrate O2 for sat of 88-92%.  Pulmonary infiltrate: HCAP vs mass             - CT of the chest  ?Aspiration:             - SLP   PCCM will see again on Monday, call back if needed sooner.  Patient seen and examined, agree with above note.  I dictated the care and orders written for this patient under my direction.  Rush Farmer, [place]

## 2016-12-31 ENCOUNTER — Inpatient Hospital Stay (HOSPITAL_COMMUNITY): Payer: Medicaid Other

## 2016-12-31 DIAGNOSIS — I469 Cardiac arrest, cause unspecified: Secondary | ICD-10-CM

## 2016-12-31 DIAGNOSIS — E1151 Type 2 diabetes mellitus with diabetic peripheral angiopathy without gangrene: Secondary | ICD-10-CM

## 2016-12-31 DIAGNOSIS — I429 Cardiomyopathy, unspecified: Secondary | ICD-10-CM

## 2016-12-31 DIAGNOSIS — J9600 Acute respiratory failure, unspecified whether with hypoxia or hypercapnia: Secondary | ICD-10-CM

## 2016-12-31 LAB — CBC
HEMATOCRIT: 22.1 % — AB (ref 36.0–46.0)
HEMOGLOBIN: 7.1 g/dL — AB (ref 12.0–15.0)
MCH: 26.7 pg (ref 26.0–34.0)
MCHC: 32.1 g/dL (ref 30.0–36.0)
MCV: 83.1 fL (ref 78.0–100.0)
Platelets: 321 10*3/uL (ref 150–400)
RBC: 2.66 MIL/uL — ABNORMAL LOW (ref 3.87–5.11)
RDW: 16.3 % — ABNORMAL HIGH (ref 11.5–15.5)
WBC: 9 10*3/uL (ref 4.0–10.5)

## 2016-12-31 LAB — BASIC METABOLIC PANEL
ANION GAP: 10 (ref 5–15)
BUN: 28 mg/dL — AB (ref 6–20)
CO2: 19 mmol/L — AB (ref 22–32)
Calcium: 7.8 mg/dL — ABNORMAL LOW (ref 8.9–10.3)
Chloride: 105 mmol/L (ref 101–111)
Creatinine, Ser: 3.6 mg/dL — ABNORMAL HIGH (ref 0.44–1.00)
GFR calc Af Amer: 16 mL/min — ABNORMAL LOW (ref >60)
GFR calc non Af Amer: 14 mL/min — ABNORMAL LOW (ref >60)
GLUCOSE: 142 mg/dL — AB (ref 65–99)
POTASSIUM: 5 mmol/L (ref 3.5–5.1)
Sodium: 134 mmol/L — ABNORMAL LOW (ref 135–145)

## 2016-12-31 LAB — MAGNESIUM: Magnesium: 1.9 mg/dL (ref 1.7–2.4)

## 2016-12-31 LAB — RESPIRATORY PANEL BY PCR
Adenovirus: NOT DETECTED
BORDETELLA PERTUSSIS-RVPCR: NOT DETECTED
CHLAMYDOPHILA PNEUMONIAE-RVPPCR: NOT DETECTED
CORONAVIRUS 229E-RVPPCR: NOT DETECTED
CORONAVIRUS HKU1-RVPPCR: NOT DETECTED
Coronavirus NL63: NOT DETECTED
Coronavirus OC43: NOT DETECTED
INFLUENZA B-RVPPCR: NOT DETECTED
Influenza A: NOT DETECTED
METAPNEUMOVIRUS-RVPPCR: NOT DETECTED
Mycoplasma pneumoniae: NOT DETECTED
Parainfluenza Virus 1: NOT DETECTED
Parainfluenza Virus 2: NOT DETECTED
Parainfluenza Virus 3: NOT DETECTED
Parainfluenza Virus 4: NOT DETECTED
RESPIRATORY SYNCYTIAL VIRUS-RVPPCR: NOT DETECTED
Rhinovirus / Enterovirus: NOT DETECTED

## 2016-12-31 LAB — GLUCOSE, CAPILLARY
GLUCOSE-CAPILLARY: 108 mg/dL — AB (ref 65–99)
GLUCOSE-CAPILLARY: 188 mg/dL — AB (ref 65–99)
Glucose-Capillary: 114 mg/dL — ABNORMAL HIGH (ref 65–99)
Glucose-Capillary: 141 mg/dL — ABNORMAL HIGH (ref 65–99)
Glucose-Capillary: 198 mg/dL — ABNORMAL HIGH (ref 65–99)
Glucose-Capillary: 290 mg/dL — ABNORMAL HIGH (ref 65–99)

## 2016-12-31 LAB — HEMOGLOBIN A1C
HEMOGLOBIN A1C: 9.8 % — AB (ref 4.8–5.6)
Mean Plasma Glucose: 235 mg/dL

## 2016-12-31 LAB — HIV ANTIBODY (ROUTINE TESTING W REFLEX): HIV SCREEN 4TH GENERATION: NONREACTIVE

## 2016-12-31 LAB — ECHOCARDIOGRAM COMPLETE
Height: 64 in
Weight: 2987.67 [oz_av]

## 2016-12-31 LAB — URINE CULTURE: Culture: NO GROWTH

## 2016-12-31 LAB — PHOSPHORUS: Phosphorus: 4.8 mg/dL — ABNORMAL HIGH (ref 2.5–4.6)

## 2016-12-31 LAB — PROCALCITONIN: PROCALCITONIN: 1.01 ng/mL

## 2016-12-31 LAB — LACTIC ACID, PLASMA: LACTIC ACID, VENOUS: 1 mmol/L (ref 0.5–1.9)

## 2016-12-31 LAB — TROPONIN I
Troponin I: 0.05 ng/mL
Troponin I: 0.06 ng/mL

## 2016-12-31 MED ORDER — GABAPENTIN 300 MG PO CAPS: 600.0000 mg | ORAL_CAPSULE | Freq: Two times a day (BID) | ORAL | Status: DC

## 2016-12-31 MED ORDER — FUROSEMIDE 10 MG/ML IJ SOLN
40.0000 mg | Freq: Once | INTRAMUSCULAR | Status: AC
  Administered 2016-12-31: 40 mg via INTRAVENOUS
  Filled 2016-12-31: qty 4

## 2016-12-31 MED ORDER — SODIUM BICARBONATE 650 MG PO TABS
650.0000 mg | ORAL_TABLET | Freq: Three times a day (TID) | ORAL | Status: DC
  Administered 2016-12-31 – 2017-01-02 (×6): 650 mg via ORAL
  Filled 2016-12-31 (×7): qty 1

## 2016-12-31 MED ORDER — INSULIN ASPART 100 UNIT/ML ~~LOC~~ SOLN
0.0000 [IU] | SUBCUTANEOUS | Status: DC
  Administered 2016-12-31: 8 [IU] via SUBCUTANEOUS
  Administered 2016-12-31 – 2017-01-01 (×3): 3 [IU] via SUBCUTANEOUS
  Administered 2017-01-01: 5 [IU] via SUBCUTANEOUS
  Administered 2017-01-01 (×3): 3 [IU] via SUBCUTANEOUS
  Administered 2017-01-02: 2 [IU] via SUBCUTANEOUS
  Administered 2017-01-02: 3 [IU] via SUBCUTANEOUS
  Administered 2017-01-02: 2 [IU] via SUBCUTANEOUS
  Administered 2017-01-02 (×2): 3 [IU] via SUBCUTANEOUS
  Administered 2017-01-03: 5 [IU] via SUBCUTANEOUS
  Administered 2017-01-03 (×2): 2 [IU] via SUBCUTANEOUS
  Administered 2017-01-03: 8 [IU] via SUBCUTANEOUS
  Administered 2017-01-03 (×2): 5 [IU] via SUBCUTANEOUS
  Administered 2017-01-04: 3 [IU] via SUBCUTANEOUS
  Administered 2017-01-04: 8 [IU] via SUBCUTANEOUS
  Administered 2017-01-04: 2 [IU] via SUBCUTANEOUS
  Administered 2017-01-04: 8 [IU] via SUBCUTANEOUS
  Administered 2017-01-04: 3 [IU] via SUBCUTANEOUS
  Administered 2017-01-05: 2 [IU] via SUBCUTANEOUS

## 2016-12-31 MED ORDER — GABAPENTIN 300 MG PO CAPS
600.0000 mg | ORAL_CAPSULE | Freq: Two times a day (BID) | ORAL | Status: DC
  Administered 2016-12-31 – 2017-01-05 (×10): 600 mg via ORAL
  Filled 2016-12-31 (×11): qty 2

## 2016-12-31 NOTE — Progress Notes (Signed)
eLink Physician-Brief Progress Note Patient Name: Cynthia Dillon [place] DOB: Dec 10, 1965 MRN: [id number]   Date of Service  12/31/2016  HPI/Events of Note  Blood glucose = 290. Currently on Q 4 hour standard Novolog SSI.  eICU Interventions  Will order: 1. Increase to Q 4 hour moderate Novolog SSI.     Intervention Category Major Interventions: Hyperglycemia - active titration of insulin therapy  Lenell AntuSommer,Eveline Sauve [place] 12/31/2016, 4:43 PM

## 2016-12-31 NOTE — Progress Notes (Signed)
Pt on 60% PRB mask, sating 100%.  Atttempted to place patient on 40% venturi mask.  Sats dropped to 80s.  Pt placed back on 60% PRB.

## 2016-12-31 NOTE — Progress Notes (Signed)
VASCULAR LAB PRELIMINARY  PRELIMINARY  PRELIMINARY  PRELIMINARY  Bilateral lower extremity venous duplex completed.    Preliminary report:  There is no DVT or SVT noted in the bilateral lower extremities.   Jakyria Bleau, RVT 12/31/2016, 3:38 PM

## 2016-12-31 NOTE — Progress Notes (Signed)
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  She presented to [facility] Tuesday with fever 103, chills, np cough and hypoxia. Proved refractory to treatment and transferred to [facility] 5/18 for further evaluation. No labs or xrays currently available. We will continue to work up.  SUBJECTIVE:  Last nights events reviewed. Cardiac arrest w/ 6 minutes to ROSC. Felt hypoxia related.  Feels better  VITAL SIGNS: BP 111/83   Pulse 90   Temp 98.4 F (36.9 C) (Oral)   Resp (!) 24   Ht 5\' 4"  (1.626 m)   Wt 186 lb 11.7 oz (84.7 kg)   LMP 12/07/2016 (Approximate) Comment: irregular  SpO2 95%   BMI 32.05 kg/m  NRB HEMODYNAMICS:    VENTILATOR SETTINGS: FiO2 (%):  [60 %-100 %] 60 %  INTAKE / OUTPUT:  Intake/Output Summary (Last 24 hours) at 12/31/16 1037 Last data filed at 12/31/16 0900  Gross per 24 hour  Intake           1292.5 ml  Output             1020 ml  Net            272.5 ml     PHYSICAL EXAMINATION: General appearance:  Chronically ill appearing 51 Year old  female, well nourished/ NAD,  conversant  Eyes: anicteric sclerae, moist conjunctivae; PERRL, EOMI bilaterally. Mouth:  membranes and no mucosal ulcerations; normal hard and soft palate Neck: Trachea midline; neck supple, no JVD Lungs/chest: bilateral rales, with normal respiratory effort and no intercostal retractions CV: RRR, no MRGs  Abdomen: Soft, non-tender; no masses or HSM Extremities: No peripheral edema or extremity lymphadenopathy, left foot dressing intact Skin: Normal temperature, turgor and texture; no rash, ulcers or subcutaneous  nodules Neuro/ Psych: Appropriate affect, alert and oriented to person, place and time   LABS:  BMET  Recent Labs Lab 12/30/16 1254 12/30/16 2000 12/31/16 0037  NA 130* 132* 134*  K 4.7 4.8 5.0  CL 103 103 105  CO2 17* 19* 19*  BUN 29* 29* 28*  CREATININE 3.51* 3.48* 3.60*  GLUCOSE 163* 121* 142*    Electrolytes  Recent Labs Lab 12/30/16 1254 12/30/16 2000 12/31/16 0037 12/31/16 0635  CALCIUM 7.7* 7.8* 7.8*  --   MG 1.7 1.7  --  1.9  PHOS  --  3.9  --  4.8*    CBC  Recent Labs Lab 12/30/16 1254 12/30/16 1903 12/31/16 0037  WBC 8.6 9.5 9.0  HGB 7.1* 7.4* 7.1*  HCT 23.1* 24.0* 22.1*  PLT 362 361 321    Coag's  Recent Labs Lab 12/30/16 1254  APTT 36  INR 1.17    Sepsis Markers  Recent Labs Lab 12/30/16 1254 12/30/16 1302 12/31/16 0037  LATICACIDVEN  --  0.8 1.0  PROCALCITON 0.79  --  1.01    ABG  Recent Labs Lab 12/30/16 1300 12/30/16 1855  PHART 7.285* 7.219*  PCO2ART 40.6 40.3  PO2ART 87.4 80.7*    Liver Enzymes  Recent Labs Lab 12/30/16 1254  AST 23  ALT 19  ALKPHOS 154*  BILITOT 0.6  ALBUMIN 2.2*  Cardiac Enzymes  Recent Labs Lab 12/30/16 1903 12/31/16 0037 12/31/16 0635  TROPONINI 0.04* 0.05* 0.06*    Glucose  Recent Labs Lab 12/30/16 1707 12/30/16 1900 12/30/16 2041 12/31/16 0012 12/31/16 0414 12/31/16 0810  GLUCAP 217* 249* 119* 141* 108* 114*    Imaging Ct Chest Wo Contrast  Result Date: 12/30/2016 CLINICAL DATA:  Hypoxemia EXAM: CT CHEST WITHOUT CONTRAST TECHNIQUE: Multidetector CT imaging of the chest was performed following the standard protocol without IV contrast. COMPARISON:  Radiograph 12/30/2016 FINDINGS: Cardiovascular: Limited without intravenous contrast. Non aneurysmal aorta. Scattered atherosclerotic calcification. Heart size upper normal. No large pericardial effusion. Mediastinum/Nodes: Mild mediastinal adenopathy. For example a lymph node adjacent to the aortic arch measures 9  mm. Midline trachea. A right paratracheal lymph node measures 1 cm. No thyroid mass. Esophagus within normal limits. Lungs/Pleura: Small bilateral pleural effusions. Extensive consolidation within the bilateral lower lobes with scattered ground-glass densities and partial consolidations in the bilateral upper lobes, relative sparing of the right middle lobe. There are multiple subcentimeter pulmonary nodules, right greater than left. Suspect that these are inflammatory or infectious. No pneumothorax Upper Abdomen: No acute abnormality Musculoskeletal: No acute or suspicious bone lesion. Old left fifth and sixth rib fractures. IMPRESSION: 1. Small bilateral pleural effusions with extensive bilateral lower lobe consolidations with additional ground-glass density and multifocal consolidations in the upper lobes, concerning for multifocal pneumonia. 2. There are multiple subcentimeter bilateral pulmonary nodules, right greater than left, suspect that these are inflammatory or infectious. Non-contrast chest CT at 3-6 months is recommended. If the nodules are stable at time of repeat CT, then future CT at 18-24 months (from today's scan) is considered optional for low-risk patients, but is recommended for high-risk patients. This recommendation follows the consensus statement: Guidelines for Management of Incidental Pulmonary Nodules Detected on CT Images: From the Fleischner Society 2017; Radiology 2017; 284:228-243. 3. There is mild mediastinal adenopathy, which may be reactive. Electronically Signed   By: Jasmine PangKim  Fujinaga M.D.   On: 12/30/2016 18:44   Dg Chest Port 1 View  Result Date: 12/30/2016 CLINICAL DATA:  Post cardiac arrest EXAM: PORTABLE CHEST 1 VIEW COMPARISON:  12/30/2016 FINDINGS: Borderline cardiomegaly. Small bilateral effusions. Central vascular congestion with worsened bilateral pulmonary edema. Bibasilar consolidations. No pneumothorax. IMPRESSION: 1. Borderline cardiomegaly with worsened central  vascular congestion and pulmonary edema. 2. There are bilateral pleural effusions and bibasilar infiltrates. Electronically Signed   By: Jasmine PangKim  Fujinaga M.D.   On: 12/30/2016 20:35   Dg Chest Port 1 View  Result Date: 12/30/2016 CLINICAL DATA:  Respiratory failure for 4 days. EXAM: PORTABLE CHEST 1 VIEW COMPARISON:  None. FINDINGS: The cardiomediastinal silhouette is unremarkable. Pulmonary vascular congestion and possible interstitial edema noted. Bilateral opacities within the mid and lower lungs bilaterally noted with trace bilateral pleural effusions. There is no evidence of pneumothorax. No acute bony abnormalities are identified. IMPRESSION: Bilateral lower lung opacities which may represent airspace disease/edema and/or atelectasis. Trace bilateral pleural effusions. Pulmonary vascular congestion and possible interstitial pulmonary edema. Electronically Signed   By: Harmon PierJeffrey  Hu M.D.   On: 12/30/2016 13:29     STUDIES:  CT scan 5/18: 1. Small bilateral pleural effusions with extensive bilateral lower lobe consolidations with additional ground-glass density and multifocal consolidations in the upper lobes, concerning for multifocal pneumonia. 2. There are multiple subcentimeter bilateral pulmonary nodules, right greater than left, suspect that these are inflammatory or infectious  CULTURES: 5/18 bc x 2>> 5/18 sputum>> 5/18 uc>>  ANTIBIOTICS: 5/18 primaxin>>  SIGNIFICANT EVENTS:  5/18 tx to [facility], cardiac arrest hypoxia related 6 minutes to ROSC.  5/19 ambulatory no distress   LINES/TUBES:  ASSESSMENT / PLAN:  PULMONARY A: Acute Hypoxic respiratory failure in setting of CAP (NOS to date)/ ARDS Pulmonary Nodules-->likely reactive  -CXR w/ persistent diffuse bilateral airspace disease (personally reviewed from 5/18). CT imaging w/ dense bibasilar changes.  -still requiring high FIO2 P:   Cont abx Ck resp viral panel  Lasix X1 Wean O2; attempt high flow.  Add  IS  CARDIOVASCULAR A:  Cardiac arrest. Suspect hypoxia mediated (7 minutes to ROSC) Hx of CAD P:  F/u ECHO F/u vasc US Repeat trop I  Cont tele   RENAL A:   Chronic renal insuff (stage not specified) looks like BL cr ~3.5  Fluid and electrolyte imbalance: hyponatremia, progressive hyperkalemia, mixed AG and NAGMA. Suspect residual from CPR and c/b renal disease.  P:   Cont bicarb gtt at adjusted rate Serial chemistries Strict I&O Renal dose meds   HEMATOLOGIC A:   Anemia of critical illness no evidence of bleeding P:  Cont [place] heparin Trend cbc Transfuse per ICU protocol INFECTIOUS A:   CAP (NOS) Chronic diabetic foot ulcer.  P:   Day 2 zosyn and azith; abx day # 5 ID following  ENDOCRINE A:   DM P:   ssi   NEUROLOGIC A:   Awake and alert but poor insight into current illness P:   Supportive care    FAMILY  - Updates: Pt updated , no family  - Inter-disciplinary family meet or Palliative Care meeting due by:  day 7  Discussion CAP w/ associated respiratory failure c/b either element of ARDS or pulmonary edema. I think arrest last night was d/t hypoxia and not primary cardiac in origin.  For today: lasix X 1; KVO IVFs, change bicarb to PO. Start diet. Try high flow. May be able to transfer out of icu in next 24 hrs. Awaiting ECHO and LE [place] results.   My ccm time 32 minutes.   Simonne Martinet ACNP-BC [facility] Pulmonary/Critical Care Pager # [phone] OR # [phone] if no answer

## 2016-12-31 NOTE — Evaluation (Addendum)
Physical Therapy Evaluation Patient Details Name: Cynthia Dillon MRN: [id number] DOB: 07/08/1966 Today's Date: 12/31/2016   History of Present Illness  51 yo admitted wot [place] with PNA and respiratory difficulty. After transport to CT pt found unresponsive and hypoxic, code blue 5/18. PMhx: DM, left transmet, CKD, neuropathy, HTN, anemia  Clinical Impression  Pt very pleasant and willing to be OOB although she reports generalized pain after code and compressions. Pt normally able to care for herself and uses AD sporadically. Pt with supportive family who can provide assist as needed. Pt used 2L at home PRN and currently requiring maintained PRB at 15L throughout. Pt with decreased activity tolerance, gait and cardiopulmonary function who will benefit from acute therapy to maximize mobility, function, gait and independence to decrease burden of care.   sats 99% on 15L PRB at rest, decreased to 88% with 15' of gait HR 92 BP 111/83 sitting    Follow Up Recommendations Home health PT    Equipment Recommendations  None recommended by PT    Recommendations for Other Services OT consult     Precautions / Restrictions Precautions Precautions: Fall Precaution Comments: watch sats closely, required PRB for gait Required Braces or Orthoses: Other Brace/Splint Other Brace/Splint: post op shoe left foot Restrictions Weight Bearing Restrictions: No      Mobility  Bed Mobility Overal bed mobility: Needs Assistance Bed Mobility: Supine to Sit     Supine to sit: Min assist     General bed mobility comments: min hand held assist to fully elevate trunk from surface with increased time  Transfers Overall transfer level: Needs assistance   Transfers: Sit to/from Stand Sit to Stand: Min guard         General transfer comment: cues for hand placement and safety, guarding for lines  Ambulation/Gait Ambulation/Gait assistance: Min guard;+2 safety/equipment Ambulation  Distance (Feet): 15 Feet Assistive device: Rolling walker (2 wheeled) Gait Pattern/deviations: Step-through pattern;Decreased stride length   Gait velocity interpretation: Below normal speed for age/gender General Gait Details: cues for posture and breathing technique with chair to follow as pt desaturating to 88% on PRB at 15L after 15'  Stairs            Wheelchair Mobility    Modified Rankin (Stroke Patients Only)       Balance Overall balance assessment: Needs assistance   Sitting balance-Leahy Scale: Good       Standing balance-Leahy Scale: Good                               Pertinent Vitals/Pain Pain Assessment: 0-10 Pain Score: 4  Pain Location: all over, chest after compressions Pain Descriptors / Indicators: Sore Pain Intervention(s): Limited activity within patient's tolerance;Repositioned;Monitored during session    Home Living Family/patient expects to be discharged to:: Private residence Living Arrangements: Spouse/significant other Available Help at Discharge: Family;Available 24 hours/day Type of Home: Mobile home Home Access: Ramped entrance     Home Layout: One level Home Equipment: Walker - 2 wheels;Wheelchair - manual      Prior Function Level of Independence: Independent         Comments: pt uses RW on occasion grossly 1x/month, has been sponge bathing due to wound on transmet     Hand Dominance        Extremity/Trunk Assessment   Upper Extremity Assessment Upper Extremity Assessment: Overall WFL for tasks assessed    Lower Extremity Assessment Lower  Extremity Assessment: Overall WFL for tasks assessed    Cervical / Trunk Assessment Cervical / Trunk Assessment: Normal  Communication   Communication: No difficulties  Cognition Arousal/Alertness: Awake/alert Behavior During Therapy: WFL for tasks assessed/performed Overall Cognitive Status: Within Functional Limits for tasks assessed                                         General Comments      Exercises     Assessment/Plan    PT Assessment Patient needs continued PT services  PT Problem List Decreased mobility;Decreased activity tolerance;Decreased knowledge of use of DME;Pain;Cardiopulmonary status limiting activity       PT Treatment Interventions Gait training;Therapeutic exercise;Patient/family education;DME instruction;Therapeutic activities;Functional mobility training    PT Goals (Current goals can be found in the Care Plan section)  Acute Rehab PT Goals Patient Stated Goal: return home PT Goal Formulation: With patient/family Time For Goal Achievement: 01/14/17 Potential to Achieve Goals: Good    Frequency Min 3X/week   Barriers to discharge   spouse works but reports other family can assist while he is gone    Co-evaluation               AM-PAC PT "6 Clicks" Daily Activity  Outcome Measure Difficulty turning over in bed (including adjusting bedclothes, sheets and blankets)?: A Little Difficulty moving from lying on back to sitting on the side of the bed? : Total Difficulty sitting down on and standing up from a chair with arms (e.g., wheelchair, bedside commode, etc,.)?: A Little Help needed moving to and from a bed to chair (including a wheelchair)?: A Little Help needed walking in hospital room?: A Little Help needed climbing 3-5 steps with a railing? : A Little 6 Click Score: 16    End of Session Equipment Utilized During Treatment: Gait belt;Oxygen;Other (comment) (post op shoe left) Activity Tolerance: Patient tolerated treatment well Patient left: in chair;with call bell/phone within reach;with family/visitor present;with chair alarm set Nurse Communication: Mobility status;Precautions PT Visit Diagnosis: Other abnormalities of gait and mobility (R26.89);Difficulty in walking, not elsewhere classified (R26.2)    Time: [id number] PT Time Calculation (min) (ACUTE ONLY): 23  min   Charges:   PT Evaluation $PT Eval Moderate Complexity: 1 Procedure PT Treatments $Gait Training: 8-22 mins   PT G Codes:        Delaney MeigsMaija Tabor Maddelynn Moosman, PT [phone]   Floreine Kingdon B Lebaron Bautch 12/31/2016, 10:37 AM

## 2016-12-31 NOTE — Progress Notes (Signed)
Patient tolerating 13L high flow nasal cannula well at this time. Patient not showing signs of acute respiratory distress at this time, BIPAP not placed on, but at bedside if needed. RT will continue to monitor.

## 2017-01-01 ENCOUNTER — Inpatient Hospital Stay (HOSPITAL_COMMUNITY): Payer: Medicaid Other

## 2017-01-01 LAB — CBC
HCT: 19.2 % — ABNORMAL LOW (ref 36.0–46.0)
HEMATOCRIT: 23.7 % — AB (ref 36.0–46.0)
Hemoglobin: 6.1 g/dL — CL (ref 12.0–15.0)
Hemoglobin: 7.6 g/dL — ABNORMAL LOW (ref 12.0–15.0)
MCH: 25.8 pg — ABNORMAL LOW (ref 26.0–34.0)
MCH: 26.5 pg (ref 26.0–34.0)
MCHC: 31.8 g/dL (ref 30.0–36.0)
MCHC: 32.1 g/dL (ref 30.0–36.0)
MCV: 81.4 fL (ref 78.0–100.0)
MCV: 82.6 fL (ref 78.0–100.0)
PLATELETS: 296 10*3/uL (ref 150–400)
Platelets: 290 10*3/uL (ref 150–400)
RBC: 2.36 MIL/uL — ABNORMAL LOW (ref 3.87–5.11)
RBC: 2.87 MIL/uL — AB (ref 3.87–5.11)
RDW: 16 % — ABNORMAL HIGH (ref 11.5–15.5)
RDW: 15.8 % — AB (ref 11.5–15.5)
WBC: 7.5 10*3/uL (ref 4.0–10.5)
WBC: 8.4 10*3/uL (ref 4.0–10.5)

## 2017-01-01 LAB — GLUCOSE, CAPILLARY
GLUCOSE-CAPILLARY: 153 mg/dL — AB (ref 65–99)
Glucose-Capillary: 245 mg/dL — ABNORMAL HIGH (ref 65–99)
Glucose-Capillary: 190 mg/dL — ABNORMAL HIGH (ref 65–99)
Glucose-Capillary: 163 mg/dL — ABNORMAL HIGH (ref 65–99)
Glucose-Capillary: 174 mg/dL — ABNORMAL HIGH (ref 65–99)
Glucose-Capillary: 155 mg/dL — ABNORMAL HIGH (ref 65–99)

## 2017-01-01 LAB — URINE CULTURE: Culture: NO GROWTH

## 2017-01-01 LAB — BASIC METABOLIC PANEL
Anion gap: 12 (ref 5–15)
BUN: 31 mg/dL — AB (ref 6–20)
CHLORIDE: 98 mmol/L — AB (ref 101–111)
CO2: 22 mmol/L (ref 22–32)
Calcium: 7.3 mg/dL — ABNORMAL LOW (ref 8.9–10.3)
Creatinine, Ser: 3.69 mg/dL — ABNORMAL HIGH (ref 0.44–1.00)
GFR calc Af Amer: 15 mL/min — ABNORMAL LOW (ref >60)
GFR calc non Af Amer: 13 mL/min — ABNORMAL LOW (ref >60)
GLUCOSE: 225 mg/dL — AB (ref 65–99)
POTASSIUM: 3.9 mmol/L (ref 3.5–5.1)
Sodium: 132 mmol/L — ABNORMAL LOW (ref 135–145)

## 2017-01-01 LAB — PREPARE RBC (CROSSMATCH)

## 2017-01-01 LAB — PROCALCITONIN: PROCALCITONIN: 14.13 ng/mL

## 2017-01-01 LAB — ABO/RH: ABO/RH(D): A POS

## 2017-01-01 MED ORDER — SODIUM CHLORIDE 0.9 % IV SOLN
Freq: Once | INTRAVENOUS | Status: AC
  Administered 2017-01-01: 10 mL/h via INTRAVENOUS

## 2017-01-01 MED ORDER — FUROSEMIDE 10 MG/ML IJ SOLN
80.0000 mg | Freq: Two times a day (BID) | INTRAMUSCULAR | Status: DC
  Administered 2017-01-01 – 2017-01-04 (×7): 80 mg via INTRAVENOUS
  Filled 2017-01-01 (×8): qty 8

## 2017-01-01 MED ORDER — LINEZOLID 600 MG/300ML IV SOLN
600.0000 mg | Freq: Two times a day (BID) | INTRAVENOUS | Status: DC
  Administered 2017-01-01 – 2017-01-06 (×11): 600 mg via INTRAVENOUS
  Filled 2017-01-01 (×13): qty 300

## 2017-01-01 MED FILL — Medication: Qty: 1 | Status: AC

## 2017-01-01 NOTE — Progress Notes (Signed)
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  She presented to [facility] Tuesday with fever 103, chills, np cough and hypoxia. Proved refractory to treatment and transferred to [facility] 5/18 for further evaluation. No labs or xrays currently available. We will continue to work up.  SUBJECTIVE:  Febrile last night. Now on 100% NRB. Saturations dropping to 70-80s w/ activity. CXR worse.   VITAL SIGNS: BP 140/72   Pulse (!) 105   Temp 100.2 F (37.9 C) (Oral)   Resp (!) 24   Ht 5\' 4"  (1.626 m)   Wt 188 lb 15 oz (85.7 kg)   LMP 12/07/2016 (Approximate) Comment: irregular  SpO2 92%   BMI 32.43 kg/m  NRB HEMODYNAMICS:    VENTILATOR SETTINGS: FiO2 (%):  [100 %] 100 %  INTAKE / OUTPUT:  Intake/Output Summary (Last 24 hours) at 01/01/17 0912 Last data filed at 01/01/17 0745  Gross per 24 hour  Intake              870 ml  Output             1650 ml  Net             -780 ml   General appearance:  51 Year old  female, well nourished/ cachectic  NAD,  conversant but on 100% NRB Eyes: anicteric sclerae, moist conjunctivae; PERRL, EOMI bilaterally. Mouth:  membranes and no mucosal ulcerations; normal hard and soft palate Neck: Trachea midline; neck supple, no JVD Lungs/chest: Crackles bilaterally, with normal respiratory effort and no intercostal retractions CV: RRR, no MRGs  Abdomen: Soft, non-tender; no masses or HSM Extremities: No peripheral edema or extremity lymphadenopathy Skin: Normal temperature, turgor and texture; no rash, ulcers or subcutaneous nodules Neuro/Psych: Appropriate affect, alert and oriented  to person, place and time   LABS:  BMET  Recent Labs Lab 12/30/16 2000 12/31/16 0037 01/01/17 0325  NA 132* 134* 132*  K 4.8 5.0 3.9  CL 103 105 98*  CO2 19* 19* 22  BUN 29* 28* 31*  CREATININE 3.48* 3.60* 3.69*  GLUCOSE 121* 142* 225*    Electrolytes  Recent Labs Lab 12/30/16 1254 12/30/16 2000 12/31/16 0037 12/31/16 0635 01/01/17 0325  CALCIUM 7.7* 7.8* 7.8*  --  7.3*  MG 1.7 1.7  --  1.9  --   PHOS  --  3.9  --  4.8*  --     CBC  Recent Labs Lab 12/30/16 1903 12/31/16 0037 01/01/17 0325  WBC 9.5 9.0 7.5  HGB 7.4* 7.1* 6.1*  HCT 24.0* 22.1* 19.2*  PLT 361 321 290    Coag's  Recent Labs Lab 12/30/16 1254  APTT 36  INR 1.17    Sepsis Markers  Recent Labs Lab 12/30/16 1254 12/30/16 1302 12/31/16 0037 01/01/17 0325  LATICACIDVEN  --  0.8 1.0  --   PROCALCITON 0.79  --  1.01 14.13    ABG  Recent Labs Lab 12/30/16 1300 12/30/16 1855  PHART 7.285* 7.219*  PCO2ART 40.6 40.3  PO2ART 87.4 80.7*    Liver Enzymes  Recent Labs Lab 12/30/16 1254  AST 23  ALT  19  ALKPHOS 154*  BILITOT 0.6  ALBUMIN 2.2*    Cardiac Enzymes  Recent Labs Lab 12/30/16 1903 12/31/16 0037 12/31/16 0635  TROPONINI 0.04* 0.05* 0.06*    Glucose  Recent Labs Lab 12/31/16 1156 12/31/16 1616 12/31/16 2053 01/01/17 0118 01/01/17 0417 01/01/17 0840  GLUCAP 188* 290* 198* 245* 190* 163*    Imaging Dg Chest Port 1 View  Result Date: 01/01/2017 CLINICAL DATA:  Diabetes, HTN, hx of MI, PNA EXAM: PORTABLE CHEST 1 VIEW COMPARISON:  12/30/2016 FINDINGS: There is evidence of mild worsening of lung aeration most evident in the lung bases, some of which may be due to differences in patient positioning. Bilateral pleural effusions appear larger. No pneumothorax. No other change. IMPRESSION: 1. Mild worsening in lung aeration consistent with worsened interstitial airspace pulmonary edema and an increase in pleural effusions. Electronically Signed   By:  Amie Portlandavid  Ormond M.D.   On: 01/01/2017 07:31     STUDIES:  CT scan 5/18: 1. Small bilateral pleural effusions with extensive bilateral lower lobe consolidations with additional ground-glass density and multifocal consolidations in the upper lobes, concerning for multifocal pneumonia. 2. There are multiple subcentimeter bilateral pulmonary nodules, right greater than left, suspect that these are inflammatory or Infectious ECHO 5/19: normal systolic fxn, grade II diastolic dysfxn, PA 35 mmHg, mild MR, LA dilated.  LE US 5/19: negative   CULTURES: 5/18 bc x 2>> 5/18 sputum>> 5/18 uc>>neg 5/19 RVP: neg ANTIBIOTICS: 5/18 primaxin>>5/18 5/18: zosyn>> 5/18 azith>> 5/20 zyvox>>>  SIGNIFICANT EVENTS: 5/18 tx to [facility], cardiac arrest hypoxia related 6 minutes to ROSC.  5/19 ambulatory no distress  5/20 cxr worse. Increased O2 needs LINES/TUBES:  ASSESSMENT / PLAN:  PULMONARY A: Acute Hypoxic respiratory failure in setting of CAP (NOS to date)/ ARDS Pulmonary Nodules-->likely reactive  -CXR: worsening bilateral airspace disease.  -increased O2 needs P:   Cont day 3 zosyn and azith Wean O2 Cont IS Add zyvox Lasix again today   CARDIOVASCULAR A:  Cardiac arrest. Suspect hypoxia mediated (7 minutes to ROSC) Hx of CAD Grade II DD -No WM abnormality on ECHO P:  Cont tele Lasix as above  RENAL A:   Chronic renal insuff (stage not specified) looks like BL cr ~3.5  Fluid and electrolyte imbalance: hyponatremia, , mixed AG and NAGMA. Suspect residual from CPR and c/b renal disease.  -acid base improved.  -renal fxn about the same P:   Cont po bicarb Lasix  Serial chem  Renal dose meds   HEMATOLOGIC A:   Anemia of critical illness no evidence of bleeding. hgb drifted down. No evidence of bleeding. ? Hemodilution (I&O not likely accurate) P:  Transfuse X 1 May need to stop [place] heparin if hgb drifts again Trend cbc  INFECTIOUS A:   CAP (NOS) Chronic diabetic foot ulcer.   P:   abx day 6; zosyn & azith #3, add zyvox  ENDOCRINE A:   DM P:   ssi  NEUROLOGIC A:   Awake and alert but poor insight into current illness Chronic neuropathy  P:   Gabapentin adjusted.    FAMILY  - Updates: Pt updated , no family  - Inter-disciplinary family meet or Palliative Care meeting due by:  day 7  Discussion Little worse today w/ fevers, progression of CXR AND rising oxygen needs and rising PCT  Will add zyvox, cont lasix, Keep In ICU. May get worse. If intubated will need FOB.   My ccm time 34 minutes.   Simonne MartinetPeter E Karelly Dewalt  ACNP-BC [place] Pager # [phone] OR # [phone] if no answer

## 2017-01-01 NOTE — Progress Notes (Signed)
Plan to place patient on BIPAP following her PO meds this evening, patient has agreed to attempt BiPAP.

## 2017-01-01 NOTE — Progress Notes (Signed)
Received a call from family stating that daughter had removed a tick from inner thigh some days ago. Family wasn't sure if all of tick was removed. I checked both inner thighs and did not see any signs of a tick. Information passed on to CCM.

## 2017-01-01 NOTE — Progress Notes (Signed)
On arrival to patient's room, patient had just vomitted, and complaining of nausea. BIPAP not placed on at this time due to this contraindication. Patient's vitals are stable on 100% non-rebreather. RT will continue to monitor.

## 2017-01-01 NOTE — Progress Notes (Signed)
eLink Physician-Brief Progress Note Patient Name: Cynthia Dillon DOB: 02/18/1966 MRN: [id number]   Date of Service  01/01/2017  HPI/Events of Note  Hb 6.1, drop from 7.1  eICU Interventions  1 U PRBC     Intervention Category Intermediate Interventions: Bleeding - evaluation and treatment with blood products  ALVA,RAKESH V. 01/01/2017, 4:52 AM

## 2017-01-01 NOTE — Progress Notes (Signed)
Pt taken off HFNC due to sats being 80-82%.  Pt placed on 10% NRB mask and sats are 93%.  RT will continue to monitor.

## 2017-01-02 ENCOUNTER — Inpatient Hospital Stay (HOSPITAL_COMMUNITY): Payer: Medicaid Other

## 2017-01-02 ENCOUNTER — Encounter (HOSPITAL_COMMUNITY): Payer: Self-pay

## 2017-01-02 DIAGNOSIS — J189 Pneumonia, unspecified organism: Secondary | ICD-10-CM

## 2017-01-02 DIAGNOSIS — N183 Chronic kidney disease, stage 3 (moderate): Secondary | ICD-10-CM

## 2017-01-02 DIAGNOSIS — J9600 Acute respiratory failure, unspecified whether with hypoxia or hypercapnia: Secondary | ICD-10-CM

## 2017-01-02 DIAGNOSIS — N170 Acute kidney failure with tubular necrosis: Secondary | ICD-10-CM

## 2017-01-02 LAB — GLUCOSE, CAPILLARY
GLUCOSE-CAPILLARY: 110 mg/dL — AB (ref 65–99)
GLUCOSE-CAPILLARY: 131 mg/dL — AB (ref 65–99)
GLUCOSE-CAPILLARY: 179 mg/dL — AB (ref 65–99)
Glucose-Capillary: 129 mg/dL — ABNORMAL HIGH (ref 65–99)
Glucose-Capillary: 189 mg/dL — ABNORMAL HIGH (ref 65–99)
Glucose-Capillary: 198 mg/dL — ABNORMAL HIGH (ref 65–99)

## 2017-01-02 LAB — COMPREHENSIVE METABOLIC PANEL
ALT: 19 U/L (ref 14–54)
AST: 18 U/L (ref 15–41)
Albumin: 1.7 g/dL — ABNORMAL LOW (ref 3.5–5.0)
Alkaline Phosphatase: 122 U/L (ref 38–126)
Anion gap: 11 (ref 5–15)
BUN: 31 mg/dL — ABNORMAL HIGH (ref 6–20)
CALCIUM: 7.4 mg/dL — AB (ref 8.9–10.3)
CHLORIDE: 97 mmol/L — AB (ref 101–111)
CO2: 25 mmol/L (ref 22–32)
Creatinine, Ser: 3.37 mg/dL — ABNORMAL HIGH (ref 0.44–1.00)
GFR calc Af Amer: 17 mL/min — ABNORMAL LOW (ref >60)
GFR, EST NON AFRICAN AMERICAN: 15 mL/min — AB (ref >60)
Glucose, Bld: 120 mg/dL — ABNORMAL HIGH (ref 65–99)
Potassium: 3.8 mmol/L (ref 3.5–5.1)
Sodium: 133 mmol/L — ABNORMAL LOW (ref 135–145)
Total Bilirubin: 0.5 mg/dL (ref 0.3–1.2)
Total Protein: 5.9 g/dL — ABNORMAL LOW (ref 6.5–8.1)

## 2017-01-02 LAB — CBC
HCT: 23.9 % — ABNORMAL LOW (ref 36.0–46.0)
Hemoglobin: 7.5 g/dL — ABNORMAL LOW (ref 12.0–15.0)
MCH: 26 pg (ref 26.0–34.0)
MCHC: 31.4 g/dL (ref 30.0–36.0)
MCV: 83 fL (ref 78.0–100.0)
PLATELETS: 310 10*3/uL (ref 150–400)
RBC: 2.88 MIL/uL — AB (ref 3.87–5.11)
RDW: 15.7 % — ABNORMAL HIGH (ref 11.5–15.5)
WBC: 7.9 10*3/uL (ref 4.0–10.5)

## 2017-01-02 LAB — BPAM RBC
BLOOD PRODUCT EXPIRATION DATE: 201806022359
ISSUE DATE / TIME: 201805200732
Unit Type and Rh: 6200

## 2017-01-02 LAB — TYPE AND SCREEN
ABO/RH(D): A POS
Antibody Screen: NEGATIVE
Unit division: 0

## 2017-01-02 LAB — LEGIONELLA PNEUMOPHILA SEROGP 1 UR AG
L. PNEUMOPHILA SEROGP 1 UR AG: NEGATIVE
L. PNEUMOPHILA SEROGP 1 UR AG: NEGATIVE

## 2017-01-02 MED ORDER — POTASSIUM CHLORIDE CRYS ER 20 MEQ PO TBCR
20.0000 meq | EXTENDED_RELEASE_TABLET | Freq: Once | ORAL | Status: AC
Start: 2017-01-02 — End: 2017-01-02
  Administered 2017-01-02: 20 meq via ORAL
  Filled 2017-01-02: qty 1

## 2017-01-02 NOTE — Progress Notes (Addendum)
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  She presented to [facility] Tuesday with fever 103, chills, np cough and hypoxia. Proved refractory to treatment and transferred to [facility] 5/18 for further evaluation. No labs or xrays currently available. We will continue to work up.  SUBJECTIVE:  On Nasal cann O2 No distress No pressors  VITAL SIGNS: BP 121/60   Pulse 96   Temp 99.8 F (37.7 C) (Axillary)   Resp (!) 26   Ht 5\' 4"  (1.626 m)   Wt 83.1 kg (183 lb 3.2 oz)   LMP 12/07/2016 (Approximate) Comment: irregular  SpO2 93%   BMI 31.45 kg/m  NRB HEMODYNAMICS:    VENTILATOR SETTINGS: FiO2 (%):  [100 %] 100 %  INTAKE / OUTPUT:  Intake/Output Summary (Last 24 hours) at 01/02/17 1150 Last data filed at 01/02/17 1000  Gross per 24 hour  Intake              870 ml  Output             3897 ml  Net            -3027 ml   General: no distress, talkative Neuro: nonfocal, strong HEENT: jvd wnl PULM: clear CV: s1s 2RRR no GI: soft, bs wnl no r Extremities: no edema    LABS:  BMET  Recent Labs Lab 12/31/16 0037 01/01/17 0325 01/02/17 0230  NA 134* 132* 133*  K 5.0 3.9 3.8  CL 105 98* 97*  CO2 19* 22 25  BUN 28* 31* 31*  CREATININE 3.60* 3.69* 3.37*  GLUCOSE 142* 225* 120*    Electrolytes  Recent Labs Lab 12/30/16 1254 12/30/16 2000 12/31/16 0037 12/31/16 0635 01/01/17 0325 01/02/17 0230  CALCIUM 7.7* 7.8* 7.8*  --  7.3* 7.4*  MG 1.7 1.7  --  1.9  --   --   PHOS  --  3.9  --  4.8*  --   --     CBC  Recent Labs Lab 01/01/17 0325 01/01/17 1552 01/02/17 0230  WBC 7.5 8.4 7.9   HGB 6.1* 7.6* 7.5*  HCT 19.2* 23.7* 23.9*  PLT 290 296 310    Coag's  Recent Labs Lab 12/30/16 1254  APTT 36  INR 1.17    Sepsis Markers  Recent Labs Lab 12/30/16 1254 12/30/16 1302 12/31/16 0037 01/01/17 0325  LATICACIDVEN  --  0.8 1.0  --   PROCALCITON 0.79  --  1.01 14.13    ABG  Recent Labs Lab 12/30/16 1300 12/30/16 1855  PHART 7.285* 7.219*  PCO2ART 40.6 40.3  PO2ART 87.4 80.7*    Liver Enzymes  Recent Labs Lab 12/30/16 1254 01/02/17 0230  AST 23 18  ALT 19 19  ALKPHOS 154* 122  BILITOT 0.6 0.5  ALBUMIN 2.2* 1.7*    Cardiac Enzymes  Recent Labs Lab 12/30/16 1903 12/31/16 0037 12/31/16 0635  TROPONINI 0.04* 0.05* 0.06*    Glucose  Recent Labs Lab 01/01/17 1640 01/01/17 2006 01/02/17 0004 01/02/17 0424 01/02/17 0816 01/02/17 1146  GLUCAP 155* 153* 131* 110* 129* 189*    Imaging Dg Chest Port 1 [address]  Result Date: 01/02/2017 CLINICAL DATA:  51 year old female with history of pneumonia. Shortness of breath. EXAM: PORTABLE CHEST 1 VIEW COMPARISON:  Chest x-ray 01/01/2017. FINDINGS: Lung volumes are normal. Extensive multifocal interstitial and airspace disease most confluent throughout the mid to lower lungs bilaterally. Moderate bilateral pleural effusions. Cephalization of the pulmonary vasculature. Mild cardiomegaly. The patient is rotated to the left on today's exam, resulting in distortion of the mediastinal contours and reduced diagnostic sensitivity and specificity for mediastinal pathology. Atherosclerosis in the thoracic aorta. IMPRESSION: 1. The appearance the chest is very similar to prior studies. Based on comparison with recent CT examination, this presumably reflects a combination of severe multilobar pneumonia (most evident throughout the mid to lower lungs), superimposed upon a background of mild interstitial pulmonary edema from congestive heart failure, as above. 2. Aortic atherosclerosis. Electronically Signed   By:  Trudie Reedaniel  Entrikin M.D.   On: 01/02/2017 07:14     STUDIES:  CT scan 5/18: 1. Small bilateral pleural effusions with extensive bilateral lower lobe consolidations with additional ground-glass density and multifocal consolidations in the upper lobes, concerning for multifocal pneumonia. 2. There are multiple subcentimeter bilateral pulmonary nodules, right greater than left, suspect that these are inflammatory or Infectious ECHO 5/19: normal systolic fxn, grade II diastolic dysfxn, PA 35 mmHg, mild MR, LA dilated.  LE US 5/19: negative   CULTURES: 5/18 bc x 2>> 5/18 sputum>> 5/18 uc>>neg 5/19 RVP: neg ANTIBIOTICS: 5/18 primaxin>>5/18 5/18: zosyn>> 5/18 azith>> 5/20 zyvox>>>  SIGNIFICANT EVENTS: 5/18 tx to [facility], cardiac arrest hypoxia related 6 minutes to ROSC.  5/19 ambulatory no distress  5/20 cxr worse. Increased O2 needs  LINES/TUBES:  ASSESSMENT / PLAN:  PULMONARY A: Acute Hypoxic respiratory failure in setting of CAP (NOS to date)/ ARDS Pulmonary Nodules-->likely reactive  -CXR: worsening bilateral airspace disease.  -increased O2 needs P:   I reviewed CT with bilateral lower lobe infiltrates, pcxr is better after 2.3 liter neg last 24 houras Neg balance as able, she si clinically improved to nasal cann on this lasix pcxr in am  IS as able  CARDIOVASCULAR A:  Cardiac arrest. Suspect hypoxia mediated (7 minutes to ROSC) Hx of CAD Grade II DD -No WM abnormality on ECHO P:  Cont tele Lasix as above, keep same dose  RENAL A:   Chronic renal insuff (stage not specified) looks like BL cr ~3.5  Fluid and electrolyte imbalance: hyponatremia, , mixed AG and NAGMA. Suspect residual from CPR and c/b renal disease.  -acid base improved.  -renal fxn about the same P:   co2 25, dc bicarb Lasix same dose, chem in am  k supp  HEMATOLOGIC A:   Anemia of critical illness no evidence of bleeding. hgb drifted down. No evidence of bleeding. ? Hemodilution (I&O not likely  accurate) P:  Sub q hep tolerated, no bleeding Trend cbc  INFECTIOUS A:   CAP (NOS) Chronic diabetic foot ulcer.  P:   abx day 6; zosyn & azith #3, add zyvox Per ID,  Would like to NARROW ASAP pcxr improving Pct noted, consider repeat in am with following clinical status If legionella neg, dc azithro at day 5  ENDOCRINE A:   DM P:   ssi In NICE window   NEUROLOGIC A:   Awake and alert but poor insight into current illness Chronic neuropathy  P:   Gabapentin adjusted.  Pt , she ambulated   FAMILY  - Updates: Pt updated  - Inter-disciplinary family meet or Palliative Care meeting due  by:  day 7  To sdu  Mcarthur Rossetti. Tyson Alias, MD, FACP Pgr: [phone] [facility]

## 2017-01-02 NOTE — Progress Notes (Signed)
Physical Therapy Treatment Patient Details Name: Cynthia DenseShiela Ann Martin Lazenby MRN: [id number] DOB: 12-06-1965 Today's Date: 01/02/2017    History of Present Illness 51 yo admitted wot [place] with PNA and respiratory difficulty. After transport to CT pt found unresponsive and hypoxic, code blue 5/18. PMhx: DM, left transmet, CKD, neuropathy, HTN, anemia    PT Comments    Pt pleasant and reports continued overall soreness from compressions limiting mobility. On arrival pt supine with HOB 15 degrees with sats 87% on 12L HFNC, increased to 15L without change in saturation. Performed pericare with pt dropping to 80% and transitioned to PRB for transfer to sitting with sats 98% . Maintained PRB for gait then transitioned back to 15L HFNC seated in chair with pt maintaining sats 95% seated even during HEP. Pt educated for HEP, transfers and encouraged OOB daily with nursing. Will continue to follow.   HR 96    Follow Up Recommendations  Home health PT;Supervision for mobility/OOB     Equipment Recommendations       Recommendations for Other Services       Precautions / Restrictions Precautions Precautions: Fall Precaution Comments: watch sats closely, required PRB for gait Other Brace/Splint: post op shoe left foot Restrictions Weight Bearing Restrictions: No    Mobility  Bed Mobility Overal bed mobility: Needs Assistance Bed Mobility: Rolling;Sidelying to Sit Rolling: Min guard Sidelying to sit: Min assist       General bed mobility comments: cues for sequence with assist of rail to roll, physical assist to elevate trunk  Transfers Overall transfer level: Needs assistance   Transfers: Sit to/from Stand Sit to Stand: Min guard         General transfer comment: cues for hand placement and safety, guarding for lines  Ambulation/Gait Ambulation/Gait assistance: Min guard;+2 safety/equipment Ambulation Distance (Feet): 28 Feet Assistive device: Rolling walker (2  wheeled) Gait Pattern/deviations: Step-through pattern;Decreased stride length   Gait velocity interpretation: Below normal speed for age/gender General Gait Details: cues for posture and breathing technique with chair to follow as pt fatigues with gait   Stairs            Wheelchair Mobility    Modified Rankin (Stroke Patients Only)       Balance Overall balance assessment: Needs assistance   Sitting balance-Leahy Scale: Good       Standing balance-Leahy Scale: Fair                              Cognition Arousal/Alertness: Awake/alert Behavior During Therapy: WFL for tasks assessed/performed Overall Cognitive Status: Within Functional Limits for tasks assessed                                        Exercises General Exercises - Lower Extremity Long Arc Quad: AROM;Both;Seated;10 reps Hip Flexion/Marching: AROM;Seated;Both;10 reps    General Comments        Pertinent Vitals/Pain Pain Score: 4  Pain Location: all over, chest after compressions Pain Descriptors / Indicators: Sore Pain Intervention(s): Limited activity within patient's tolerance;Repositioned;Monitored during session    Home Living                      Prior Function            PT Goals (current goals can now be found in the care plan section)  Progress towards PT goals: Progressing toward goals    Frequency           PT Plan Current plan remains appropriate    Co-evaluation              AM-PAC PT "6 Clicks" Daily Activity  Outcome Measure  Difficulty turning over in bed (including adjusting bedclothes, sheets and blankets)?: A Little Difficulty moving from lying on back to sitting on the side of the bed? : Total Difficulty sitting down on and standing up from a chair with arms (e.g., wheelchair, bedside commode, etc,.)?: A Little Help needed moving to and from a bed to chair (including a wheelchair)?: A Little Help needed walking in  hospital room?: A Little Help needed climbing 3-5 steps with a railing? : A Little 6 Click Score: 16    End of Session Equipment Utilized During Treatment: Gait belt;Oxygen;Other (comment) Activity Tolerance: Patient tolerated treatment well Patient left: in chair;with call bell/phone within reach;with nursing/sitter in room Nurse Communication: Mobility status;Precautions PT Visit Diagnosis: Other abnormalities of gait and mobility (R26.89);Difficulty in walking, not elsewhere classified (R26.2)     Time: 1610-9604 PT Time Calculation (min) (ACUTE ONLY): 33 min  Charges:  $Gait Training: 8-22 mins $Therapeutic Activity: 8-22 mins                    G Codes:       Delaney Meigs, PT [phone]   Maame Dack B Dennard Vezina 01/02/2017, 9:34 AM

## 2017-01-02 NOTE — Progress Notes (Signed)
Patient was attempting to pull partial non-rebreather off, stating that she didn't want to wear anything. After removing mask, patient's SpO2 dropped to 77%. Spoke with patient and she agreed to try the high flow nasal cannula again, patient placed on 15L high flow, and SpO2 rose to 92%. RT will continue to monitor.

## 2017-01-02 NOTE — Progress Notes (Signed)
[facility]    Date of Admission:  12/30/2016   Total days of antibiotics 4        Day 4 azithromycin        Day 4 piptazo        Day 2 linezolid   ID: Cynthia Dillon is a 51 y.o. female with complicated hcap pneumonia with respiratory arrest Principal Problem:   Respiratory failure, acute (HCC) Active Problems:   DM (diabetes mellitus), type 2 with peripheral vascular complications (HCC)   Diabetic foot ulcer associated with diabetes mellitus due to underlying condition (HCC)   Lobar pneumonia (HCC)   Acute renal failure superimposed on stage 3 chronic kidney disease (HCC)   Cardiac arrest (HCC)    Subjective: Afebrile, remains on bipap/ hi flow oxygen. She had PEA code resuscitated and transferred to the icu on the evening of 5/18. Pulmonary status not significantly improved. Complains of right piv burning sensation with antibiotics  cxr showing increasing patchy infiltrates  Medications:  . collagenase   Topical Daily  . furosemide  80 mg Intravenous Q12H  . gabapentin  600 mg Oral BID  . heparin  5,000 Units Subcutaneous Q8H  . insulin aspart  0-15 Units Subcutaneous Q4H  . ipratropium-albuterol  3 mL Nebulization Q6H  . levothyroxine  12.5 mcg Intravenous Daily  . mouth rinse  15 mL Mouth Rinse BID  . pantoprazole (PROTONIX) IV  40 mg Intravenous Q24H  . sodium bicarbonate  650 mg Oral TID  . sodium chloride flush  3 mL Intravenous Q12H  . sodium chloride flush  3 mL Intravenous Q12H    Objective: Vital signs in last 24 hours: Temp:  [97.9 F (36.6 C)-100.6 F (38.1 C)] 97.9 F (36.6 C) (05/21 0425) Pulse Rate:  [80-101] 96 (05/21 0931) Resp:  [20-34] 22 (05/21 0800) BP: (97-153)/(60-97) 135/79 (05/21 0800) SpO2:  [85 %-100 %] 95 % (05/21 0931) FiO2 (%):  [100 %] 100 % (05/20 1200) Weight:  [183 lb 3.2 oz (83.1 kg)] 183 lb 3.2 oz (83.1 kg) (05/21 0500) Physical Exam  Constitutional:  oriented to person, place, and time.  appears well-developed and well-nourished. No distress.  HENT: /AT, PERRLA, no scleral icterus Mouth/Throat: Oropharynx is clear and moist. No oropharyngeal exudate.  Cardiovascular: Normal rate, regular rhythm and normal heart sounds. Exam reveals no gallop and no friction rub.  No murmur heard.  Pulmonary/Chest: Effort normal and breath sounds normal. No respiratory distress.  has no wheezes.  Neck = supple, no nuchal rigidity Abdominal: Soft. Bowel sounds are normal.  exhibits no distension. There is no tenderness.  Lymphadenopathy: no cervical adenopathy. No axillary adenopathy Neurological: alert and oriented to person, place, and time.  Skin: Skin is warm and dry. No rash noted. No erythema.  Psychiatric: a normal mood and affect.  behavior is normal.    Lab Results  Recent Labs  01/01/17 0325 01/01/17 1552 01/02/17 0230  WBC 7.5 8.4 7.9  HGB 6.1* 7.6* 7.5*  HCT 19.2* 23.7* 23.9*  NA 132*  --  133*  K 3.9  --  3.8  CL 98*  --  97*  CO2 22  --  25  BUN 31*  --  31*  CREATININE 3.69*  --  3.37*   Liver Panel  Recent Labs  12/30/16 1254 01/02/17 0230  PROT 6.7 5.9*  ALBUMIN 2.2* 1.7*  AST 23 18  ALT 19 19  ALKPHOS 154* 122  BILITOT 0.6 0.5  Sedimentation Rate No results for input(s): ESRSEDRATE in the last 72 hours. C-Reactive Protein No results for input(s): CRP in the last 72 hours.  Microbiology:  Studies/Results: Dg Chest Port 1 View  Result Date: 01/02/2017 CLINICAL DATA:  51 year old female with history of pneumonia. Shortness of breath. EXAM: PORTABLE CHEST 1 VIEW COMPARISON:  Chest x-ray 01/01/2017. FINDINGS: Lung volumes are normal. Extensive multifocal interstitial and airspace disease most confluent throughout the mid to lower lungs bilaterally. Moderate bilateral pleural effusions. Cephalization of the pulmonary vasculature. Mild cardiomegaly. The patient is rotated to the left on today's exam, resulting in distortion of the mediastinal contours  and reduced diagnostic sensitivity and specificity for mediastinal pathology. Atherosclerosis in the thoracic aorta. IMPRESSION: 1. The appearance the chest is very similar to prior studies. Based on comparison with recent CT examination, this presumably reflects a combination of severe multilobar pneumonia (most evident throughout the mid to lower lungs), superimposed upon a background of mild interstitial pulmonary edema from congestive heart failure, as above. 2. Aortic atherosclerosis. Electronically Signed   By: Trudie Reed M.D.   On: 01/02/2017 07:14   Dg Chest Port 1 View  Result Date: 01/01/2017 CLINICAL DATA:  Diabetes, HTN, hx of MI, PNA EXAM: PORTABLE CHEST 1 VIEW COMPARISON:  12/30/2016 FINDINGS: There is evidence of mild worsening of lung aeration most evident in the lung bases, some of which may be due to differences in patient positioning. Bilateral pleural effusions appear larger. No pneumothorax. No other change. IMPRESSION: 1. Mild worsening in lung aeration consistent with worsened interstitial airspace pulmonary edema and an increase in pleural effusions. Electronically Signed   By: Amie Portland M.D.   On: 01/01/2017 07:31     Assessment/Plan: Hcap = continue with linezolid plus piptazo. Recommend to have last dose of azithromycin tomorrow unless ur legionella is positive - still pending  Hypoxia = still requiring high amount of supplemental oxygen will continue to monitor  Right hand discomfort = abtx potentially causes some vein irritation. No extravagation or swelling noted on Dinah Beers, [facility] Cell: [phone] Pager: [phone]  01/02/2017, 10:28 AM

## 2017-01-02 NOTE — Progress Notes (Signed)
Patient's vitals are stable on 15L HFNC. Patient resting comfortably at this time. BIPAP is not needed at this time. RT will monitor as needed.

## 2017-01-02 NOTE — Progress Notes (Signed)
Pharmacy Antibiotic Note  Cynthia Dillon is a 51 y.o. [facility]. Patient is currently on Zosyn per pharmacy dosing, Azithromycin, and Zyvox (added 5/20 for new fever and increased PCT). SCr slowing improving with IV Lasix at 3.37.    Plan: - Continue Zosyn 2.25g IV every 8 hours - monitor SCr closely, if remains improved will consider increasing dose.  - Follow clinical progression, renal function, c/s - Consider discontinue Azithromycin after tomorrow's dose (d#5) if Legionella negative.  Height: 5\' 4"  (162.6 cm) Weight: 183 lb 3.2 oz (83.1 kg) IBW/kg (Calculated) : 54.7  Temp (24hrs), Avg:99.3 F (37.4 C), Min:97.9 F (36.6 C), Max:100 F (37.8 C)   Recent Labs Lab 12/30/16 1254 12/30/16 1302 12/30/16 1434 12/30/16 1903 12/30/16 2000 12/31/16 0037 01/01/17 0325 01/01/17 1552 01/02/17 0230  WBC 8.6  --   --  9.5  --  9.0 7.5 8.4 7.9  CREATININE 3.51*  --   --   --  3.48* 3.60* 3.69*  --  3.37*  LATICACIDVEN  --  0.8  --   --   --  1.0  --   --   --   VANCORANDOM  --   --  17  --   --   --   --   --   --     Estimated Creatinine Clearance: 20.8 mL/min (A) (by C-G formula based on SCr of 3.37 mg/dL (H)).    Allergies  Allergen Reactions  . Latex Rash    Antimicrobials this admission: 5/15 Levaquin ([facility]) >> 5/16 5/16 Vanc x 1 ([facility]) >> 5/18 5/18 Primaxin x1 Zosyn 5/16 >> Azith 5/18 >> Zyvox 5/20 >>  Dose adjustments this admission: 5/18: vancomycin 1g IV q48h ordered in response to VR of 7817mcg/mL  Microbiology results: 5/18 BCx2: pending 5/18 UCx: neg 5/18 MRSA PCR: neg 5/18 Strep pneumo- neg 5/18 Legionella - IP 5/19 Resp PCR: neg 5/20 Legionella - IP  Thank you for allowing pharmacy to be a part of this patient's care.  Link SnufferJessica Novaleigh Kohlman, PharmD, BCPS Clinical Pharmacist Clinical phone 01/02/2017 until 3:30 PM - [phone] After hours, please call [phone] 01/02/2017 12:17 PM

## 2017-01-03 ENCOUNTER — Inpatient Hospital Stay (HOSPITAL_COMMUNITY): Payer: Medicaid Other

## 2017-01-03 DIAGNOSIS — E1151 Type 2 diabetes mellitus with diabetic peripheral angiopathy without gangrene: Secondary | ICD-10-CM

## 2017-01-03 LAB — CBC WITH DIFFERENTIAL/PLATELET
BASOS ABS: 0.1 10*3/uL (ref 0.0–0.1)
BASOS PCT: 1 %
EOS PCT: 1 %
Eosinophils Absolute: 0.1 10*3/uL (ref 0.0–0.7)
HCT: 26.1 % — ABNORMAL LOW (ref 36.0–46.0)
Hemoglobin: 8.2 g/dL — ABNORMAL LOW (ref 12.0–15.0)
Lymphocytes Relative: 19 %
Lymphs Abs: 1.4 10*3/uL (ref 0.7–4.0)
MCH: 26.1 pg (ref 26.0–34.0)
MCHC: 31.4 g/dL (ref 30.0–36.0)
MCV: 83.1 fL (ref 78.0–100.0)
MONO ABS: 0.4 10*3/uL (ref 0.1–1.0)
Monocytes Relative: 6 %
Neutro Abs: 5.4 10*3/uL (ref 1.7–7.7)
Neutrophils Relative %: 73 %
PLATELETS: 307 10*3/uL (ref 150–400)
RBC: 3.14 MIL/uL — ABNORMAL LOW (ref 3.87–5.11)
RDW: 15.7 % — AB (ref 11.5–15.5)
WBC: 7.4 10*3/uL (ref 4.0–10.5)

## 2017-01-03 LAB — BASIC METABOLIC PANEL
ANION GAP: 13 (ref 5–15)
BUN: 34 mg/dL — ABNORMAL HIGH (ref 6–20)
CALCIUM: 7.5 mg/dL — AB (ref 8.9–10.3)
CHLORIDE: 93 mmol/L — AB (ref 101–111)
CO2: 24 mmol/L (ref 22–32)
CREATININE: 3.33 mg/dL — AB (ref 0.44–1.00)
GFR calc non Af Amer: 15 mL/min — ABNORMAL LOW (ref >60)
GFR, EST AFRICAN AMERICAN: 17 mL/min — AB (ref >60)
Glucose, Bld: 121 mg/dL — ABNORMAL HIGH (ref 65–99)
POTASSIUM: 3.5 mmol/L (ref 3.5–5.1)
SODIUM: 130 mmol/L — AB (ref 135–145)

## 2017-01-03 LAB — GLUCOSE, CAPILLARY
GLUCOSE-CAPILLARY: 112 mg/dL — AB (ref 65–99)
GLUCOSE-CAPILLARY: 285 mg/dL — AB (ref 65–99)
Glucose-Capillary: 144 mg/dL — ABNORMAL HIGH (ref 65–99)
Glucose-Capillary: 127 mg/dL — ABNORMAL HIGH (ref 65–99)
Glucose-Capillary: 210 mg/dL — ABNORMAL HIGH (ref 65–99)
Glucose-Capillary: 242 mg/dL — ABNORMAL HIGH (ref 65–99)

## 2017-01-03 LAB — MAGNESIUM: MAGNESIUM: 1.6 mg/dL — AB (ref 1.7–2.4)

## 2017-01-03 LAB — PHOSPHORUS: PHOSPHORUS: 3.9 mg/dL (ref 2.5–4.6)

## 2017-01-03 LAB — PROCALCITONIN: Procalcitonin: 20.99 ng/mL

## 2017-01-03 MED ORDER — PIPERACILLIN-TAZOBACTAM 3.375 G IVPB
3.3750 g | Freq: Three times a day (TID) | INTRAVENOUS | Status: DC
  Administered 2017-01-03 – 2017-01-06 (×9): 3.375 g via INTRAVENOUS
  Filled 2017-01-03 (×11): qty 50

## 2017-01-03 MED ORDER — LEVOTHYROXINE SODIUM 25 MCG PO TABS
25.0000 ug | ORAL_TABLET | Freq: Every day | ORAL | Status: DC
  Administered 2017-01-04 – 2017-01-05 (×2): 25 ug via ORAL
  Filled 2017-01-03 (×2): qty 1

## 2017-01-03 NOTE — Progress Notes (Signed)
VASCULAR LAB PRELIMINARY  ARTERIAL  ABI completed: Right ABI could not be accurately obtained due to non-compressibility of the posterior tibial artery. However, triphasic and biphasic waveforms were noted in the right foot. Left ABI of 1.11 is suggestive of arterial flow within normal limits at rest.   RIGHT    LEFT    PRESSURE WAVEFORM  PRESSURE WAVEFORM  BRACHIAL 119 Triphasic BRACHIAL 125 Triphasic  DP 145 Triphasic DP 139 Triphasic  PT >255 Biphasic PT 111 Triphasic    RIGHT LEFT  ABI  1.11     Cynthia Dillon, RVT 01/03/2017, 12:36 PM

## 2017-01-03 NOTE — Progress Notes (Signed)
Physical Therapy Treatment Patient Details Name: Cynthia Dillon MRN: [id number] DOB: 1966-04-09 Today's Date: 01/03/2017    History of Present Illness 51 yo admitted wot [place] with PNA and respiratory difficulty. After transport to CT pt found unresponsive and hypoxic, code blue 5/18. PMhx: DM, left transmet, CKD, neuropathy, HTN, anemia    PT Comments    Patient progressing well towards PT goals. Improved ambulation distance today with 2 seated rest breaks due to DOE and fatigue. Sp02 ranged from 87-93% on NRB mask. Eager to maximize independence so pt can be home by her daughter's bday next week. Still requires 12L HF [place] at rest to maintain sats. Will need to start weaning. Will follow.  Follow Up Recommendations  Home health PT;Supervision for mobility/OOB     Equipment Recommendations  None recommended by PT    Recommendations for Other Services       Precautions / Restrictions Precautions Precautions: Fall Precaution Comments: watch sats closely, required PRB for gait Required Braces or Orthoses: Other Brace/Splint Other Brace/Splint: post op shoe left foot Restrictions Weight Bearing Restrictions: No    Mobility  Bed Mobility               General bed mobility comments: Up in chair upon PT arrival.   Transfers Overall transfer level: Needs assistance Equipment used: Rolling walker (2 wheeled) Transfers: Sit to/from Stand Sit to Stand: Min guard         General transfer comment: Min guard for safety. Stood from Landscape architect.  Ambulation/Gait Ambulation/Gait assistance: Min guard;+2 safety/equipment Ambulation Distance (Feet): 100 Feet (+ 110') Assistive device: Rolling walker (2 wheeled) Gait Pattern/deviations: Step-through pattern;Decreased stride length   Gait velocity interpretation: Below normal speed for age/gender General Gait Details: Slow, mostly steady gait with 2/4 DOE. Used NRB for gait. SP02 ranged from 87-93%. 2 seated rest  breaks due to fatigue, 02.   Stairs            Wheelchair Mobility    Modified Rankin (Stroke Patients Only)       Balance Overall balance assessment: Needs assistance Sitting-balance support: Feet supported;No upper extremity supported Sitting balance-Leahy Scale: Good     Standing balance support: During functional activity Standing balance-Leahy Scale: Fair Standing balance comment: Able to stand statically without UE support; requires UE support for dynamic standing.                            Cognition Arousal/Alertness: Awake/alert Behavior During Therapy: WFL for tasks assessed/performed Overall Cognitive Status: Within Functional Limits for tasks assessed                                        Exercises      General Comments        Pertinent Vitals/Pain Pain Assessment: Faces Faces Pain Scale: Hurts little more Pain Location: all over, chest after compressions Pain Descriptors / Indicators: Sore Pain Intervention(s): Monitored during session;Repositioned    Home Living                      Prior Function            PT Goals (current goals can now be found in the care plan section) Progress towards PT goals: Progressing toward goals    Frequency    Min 3X/week  PT Plan Current plan remains appropriate    Co-evaluation              AM-PAC PT "6 Clicks" Daily Activity  Outcome Measure  Difficulty turning over in bed (including adjusting bedclothes, sheets and blankets)?: None Difficulty moving from lying on back to sitting on the side of the bed? : None Difficulty sitting down on and standing up from a chair with arms (e.g., wheelchair, bedside commode, etc,.)?: None Help needed moving to and from a bed to chair (including a wheelchair)?: A Little Help needed walking in hospital room?: A Little Help needed climbing 3-5 steps with a railing? : A Little 6 Click Score: 21    End of  Session Equipment Utilized During Treatment: Gait belt;Oxygen;Other (comment) Activity Tolerance: Patient tolerated treatment well Patient left: in chair;with call bell/phone within reach Nurse Communication: Mobility status PT Visit Diagnosis: Other abnormalities of gait and mobility (R26.89);Difficulty in walking, not elsewhere classified (R26.2)     Time: [id number] PT Time Calculation (min) (ACUTE ONLY): 23 min  Charges:  $Gait Training: 23-37 mins                    G Codes:       Cynthia Dillon, PT, DPT [phone]     Cynthia Dillon 01/03/2017, 11:02 AM

## 2017-01-03 NOTE — Progress Notes (Signed)
PROGRESS NOTE    Cynthia Dillon  ZOX:[id number] DOB: Jan 12, 1966 DOA: 12/30/2016 PCP: System, Pcp Not In   Brief Narrative:  51 year old female with poorly controlled diabetes, coronary artery disease status post MI, transmetatarsal amputation came to [facility] last week with complaints of nonproductive cough, fevers and chills along with hypoxia. CAT scan showed pneumonia therefore started on Levaquin and amoxicillin. Clinical status continued to worsen therefore antibiotics were changed to vancomycin and Zosyn. Due to acute renal failure she was switched to imipenem. Initially [facility] she was noted to elevated BNP. C. difficile was checked which was negative. Unfortunately during her stay she had a cardiac arrest/PEA therefore CPR was performed and return of spontaneous circulation after 6 minutes. She was transferred to the ICU for closer monitoring.   Assessment & Plan:   Principal Problem:   Respiratory failure, acute (HCC) Active Problems:   DM (diabetes mellitus), type 2 with peripheral vascular complications (HCC)   Diabetic foot ulcer associated with diabetes mellitus due to underlying condition (HCC)   Multifocal pneumonia   Acute renal failure superimposed on stage 3 chronic kidney disease (HCC)   Cardiac arrest (HCC)   Acute hypoxic respiratory failure likely secondary to community-acquired pneumonia/ARDS; improved -Continue Zosyn and Linezolid; discontinue azithromycin today -Supplemental oxygen -Lasix as needed -She'll need ambulatory pulse ox. Wean off oxygen  Coronary artery disease/grade 2 diastolic dysfunction Status postcardiac arrest on 12/30/2016 -Continue telemetry monitoring -Echocardiogram does not show any wall motion abnormality.  CKG stage IV -Avoid nephrotoxic drugs. Closely monitor renal function -Will need outpatient follow-up with nephrology -Monitor urine output  Uncontrolled diabetes type 2 complicated by diabetic foot ulcer and  neuropathy -A1c 9.8 on 12/30/2016 -Continue sliding scale -Blood glucose acceptable range in last 24 hours except 1 high reading. Will monitor closely  Anemia of chronic disease -Limit blood draws. No need for transfusion at this time. No signs of active bleeding  DVT prophylaxis: Subcutaneous heparin Code Status: Full Family Communication:  None at that Disposition Plan: Once able to wean her off oxygen and narrow down antibiotics she should be able to be discharged in next 48 hours.  Consultants:   Pulmonary  Infectious disease   Subjective: Patient states she feels much better in terms of respiration this morning and does not have any new complaints. She did try walking around in the hallway without oxygen on, no signs of desaturation.  Objective: Vitals:   01/03/17 0400 01/03/17 0755 01/03/17 0926 01/03/17 1130  BP: (!) 110/52 130/66 (!) 141/71 116/61  Pulse: 97 88 87 87  Resp: (!) 31 (!) 29 18 (!) 25  Temp:  98.7 F (37.1 C)  98.6 F (37 C)  TempSrc:  Oral  Oral  SpO2: 92% 97% 100% 98%  Weight: 82.6 kg (182 lb)     Height:        Intake/Output Summary (Last 24 hours) at 01/03/17 1411 Last data filed at 01/03/17 1043  Gross per 24 hour  Intake              470 ml  Output             1576 ml  Net            -1106 ml   Filed Weights   01/01/17 0500 01/02/17 0500 01/03/17 0400  Weight: 85.7 kg (188 lb 15 oz) 83.1 kg (183 lb 3.2 oz) 82.6 kg (182 lb)    Examination:  General exam: Appears calm and comfortable  Respiratory system:  Mild right-sided basilar crackles. Cardiovascular system: S1 & S2 heard, RRR. No JVD, murmurs, rubs, gallops or clicks. No pedal edema. Gastrointestinal system: Abdomen is nondistended, soft and nontender. No organomegaly or masses felt. Normal bowel sounds heard. Central nervous system: Alert and oriented. No focal neurological deficits. Extremities: Symmetric 5 x 5 power. Skin: No rashes, lesions or ulcers Psychiatry: Judgement and  insight appear normal. Mood & affect appropriate.     Data Reviewed:   CBC:  Recent Labs Lab 12/30/16 1254  12/31/16 0037 01/01/17 0325 01/01/17 1552 01/02/17 0230 01/03/17 0512  WBC 8.6  < > 9.0 7.5 8.4 7.9 7.4  NEUTROABS 5.6  --   --   --   --   --  5.4  HGB 7.1*  < > 7.1* 6.1* 7.6* 7.5* 8.2*  HCT 23.1*  < > 22.1* 19.2* 23.7* 23.9* 26.1*  MCV 83.7  < > 83.1 81.4 82.6 83.0 83.1  PLT 362  < > 321 290 296 310 307  < > = values in this interval not displayed. Basic Metabolic Panel:  Recent Labs Lab 12/30/16 1254 12/30/16 2000 12/31/16 0037 12/31/16 1610 01/01/17 0325 01/02/17 0230 01/03/17 0512  NA 130* 132* 134*  --  132* 133* 130*  K 4.7 4.8 5.0  --  3.9 3.8 3.5  CL 103 103 105  --  98* 97* 93*  CO2 17* 19* 19*  --  22 25 24   GLUCOSE 163* 121* 142*  --  225* 120* 121*  BUN 29* 29* 28*  --  31* 31* 34*  CREATININE 3.51* 3.48* 3.60*  --  3.69* 3.37* 3.33*  CALCIUM 7.7* 7.8* 7.8*  --  7.3* 7.4* 7.5*  MG 1.7 1.7  --  1.9  --   --  1.6*  PHOS  --  3.9  --  4.8*  --   --  3.9   GFR: Estimated Creatinine Clearance: 21 mL/min (A) (by C-G formula based on SCr of 3.33 mg/dL (H)). Liver Function Tests:  Recent Labs Lab 12/30/16 1254 01/02/17 0230  AST 23 18  ALT 19 19  ALKPHOS 154* 122  BILITOT 0.6 0.5  PROT 6.7 5.9*  ALBUMIN 2.2* 1.7*   No results for input(s): LIPASE, AMYLASE in the last 168 hours. No results for input(s): AMMONIA in the last 168 hours. Coagulation Profile:  Recent Labs Lab 12/30/16 1254  INR 1.17   Cardiac Enzymes:  Recent Labs Lab 12/30/16 1903 12/31/16 0037 12/31/16 0635  TROPONINI 0.04* 0.05* 0.06*   BNP (last 3 results) No results for input(s): PROBNP in the last 8760 hours. HbA1C: No results for input(s): HGBA1C in the last 72 hours. CBG:  Recent Labs Lab 01/02/17 2054 01/03/17 0103 01/03/17 0454 01/03/17 0758 01/03/17 1134  GLUCAP 179* 144* 127* 112* 285*   Lipid Profile: No results for input(s): CHOL, HDL,  LDLCALC, TRIG, CHOLHDL, LDLDIRECT in the last 72 hours. Thyroid Function Tests: No results for input(s): TSH, T4TOTAL, FREET4, T3FREE, THYROIDAB in the last 72 hours. Anemia Panel: No results for input(s): VITAMINB12, FOLATE, FERRITIN, TIBC, IRON, RETICCTPCT in the last 72 hours. Sepsis Labs:  Recent Labs Lab 12/30/16 1254 12/30/16 1302 12/31/16 0037 01/01/17 0325 01/03/17 0512  PROCALCITON 0.79  --  1.01 14.13 20.99  LATICACIDVEN  --  0.8 1.0  --   --     Recent Results (from the past 240 hour(s))  Culture, blood (routine x 2) Call MD if unable to obtain prior to antibiotics being given  Status: None (Preliminary result)   Collection Time: 12/30/16 12:54 PM  Result Value Ref Range Status   Specimen Description BLOOD RIGHT ARM  Final   Special Requests IN PEDIATRIC BOTTLE Blood Culture adequate volume  Final   Culture NO GROWTH 4 DAYS  Final   Report Status PENDING  Incomplete  Culture, blood (routine x 2) Call MD if unable to obtain prior to antibiotics being given     Status: None (Preliminary result)   Collection Time: 12/30/16 12:54 PM  Result Value Ref Range Status   Specimen Description BLOOD RIGHT HAND  Final   Special Requests IN PEDIATRIC BOTTLE Blood Culture adequate volume  Final   Culture NO GROWTH 4 DAYS  Final   Report Status PENDING  Incomplete  Urine culture     Status: None   Collection Time: 12/30/16  4:36 PM  Result Value Ref Range Status   Specimen Description URINE, RANDOM  Final   Special Requests NONE  Final   Culture NO GROWTH  Final   Report Status 12/31/2016 FINAL  Final  Urine culture     Status: None   Collection Time: 12/30/16  8:42 PM  Result Value Ref Range Status   Specimen Description URINE, CLEAN CATCH  Final   Special Requests Immunocompromised  Final   Culture NO GROWTH  Final   Report Status 01/01/2017 FINAL  Final  MRSA PCR Screening     Status: None   Collection Time: 12/30/16  8:44 PM  Result Value Ref Range Status   MRSA by  PCR NEGATIVE NEGATIVE Final    Comment:        The GeneXpert MRSA Assay (FDA approved for NASAL specimens only), is one component of a comprehensive MRSA colonization surveillance program. It is not intended to diagnose MRSA infection nor to guide or monitor treatment for MRSA infections.   Respiratory Panel by PCR     Status: None   Collection Time: 12/31/16  4:50 PM  Result Value Ref Range Status   Adenovirus NOT DETECTED NOT DETECTED Final   Coronavirus 229E NOT DETECTED NOT DETECTED Final   Coronavirus HKU1 NOT DETECTED NOT DETECTED Final   Coronavirus NL63 NOT DETECTED NOT DETECTED Final   Coronavirus OC43 NOT DETECTED NOT DETECTED Final   Metapneumovirus NOT DETECTED NOT DETECTED Final   Rhinovirus / Enterovirus NOT DETECTED NOT DETECTED Final   Influenza A NOT DETECTED NOT DETECTED Final   Influenza B NOT DETECTED NOT DETECTED Final   Parainfluenza Virus 1 NOT DETECTED NOT DETECTED Final   Parainfluenza Virus 2 NOT DETECTED NOT DETECTED Final   Parainfluenza Virus 3 NOT DETECTED NOT DETECTED Final   Parainfluenza Virus 4 NOT DETECTED NOT DETECTED Final   Respiratory Syncytial Virus NOT DETECTED NOT DETECTED Final   Bordetella pertussis NOT DETECTED NOT DETECTED Final   Chlamydophila pneumoniae NOT DETECTED NOT DETECTED Final   Mycoplasma pneumoniae NOT DETECTED NOT DETECTED Final         Radiology Studies: Dg Chest Port 1 View  Result Date: 01/03/2017 CLINICAL DATA:  Pneumonia. EXAM: PORTABLE CHEST 1 VIEW COMPARISON:  01/02/2017 .  CT 12/30/2016 FINDINGS: Mediastinum hilar structures normal. Heart size is stable. Diffuse bilateral pulmonary infiltrates and/or edema again noted. Small bilateral pleural effusions again noted. Chest is unchanged from prior exam . IMPRESSION: Diffuse bilateral pulmonary infiltrates and/or edema again noted. Small bilateral pleural effusions again noted. No significant interim change from prior exams. Electronically Signed   By: Maisie Fushomas   Register  On: 01/03/2017 07:35   Dg Chest Port 1 View  Result Date: 01/02/2017 CLINICAL DATA:  51 year old female with history of pneumonia. Shortness of breath. EXAM: PORTABLE CHEST 1 VIEW COMPARISON:  Chest x-ray 01/01/2017. FINDINGS: Lung volumes are normal. Extensive multifocal interstitial and airspace disease most confluent throughout the mid to lower lungs bilaterally. Moderate bilateral pleural effusions. Cephalization of the pulmonary vasculature. Mild cardiomegaly. The patient is rotated to the left on today's exam, resulting in distortion of the mediastinal contours and reduced diagnostic sensitivity and specificity for mediastinal pathology. Atherosclerosis in the thoracic aorta. IMPRESSION: 1. The appearance the chest is very similar to prior studies. Based on comparison with recent CT examination, this presumably reflects a combination of severe multilobar pneumonia (most evident throughout the mid to lower lungs), superimposed upon a background of mild interstitial pulmonary edema from congestive heart failure, as above. 2. Aortic atherosclerosis. Electronically Signed   By: Trudie Reed M.D.   On: 01/02/2017 07:14        Scheduled Meds: . collagenase   Topical Daily  . furosemide  80 mg Intravenous Q12H  . gabapentin  600 mg Oral BID  . heparin  5,000 Units Subcutaneous Q8H  . insulin aspart  0-15 Units Subcutaneous Q4H  . ipratropium-albuterol  3 mL Nebulization Q6H  . [START ON 01/04/2017] levothyroxine  25 mcg Oral QAC breakfast  . mouth rinse  15 mL Mouth Rinse BID  . sodium chloride flush  3 mL Intravenous Q12H   Continuous Infusions: . linezolid (ZYVOX) IV Stopped (01/03/17 1144)  . piperacillin-tazobactam (ZOSYN)  IV       LOS: 4 days    Time spent: 36 mins    Benito Lemmerman Joline Maxcy, MD [facility] Pager [phone]   If 7PM-7AM, please contact night-coverage [email or web address] Password [email or web address] 01/03/2017, 2:11 PM

## 2017-01-03 NOTE — Progress Notes (Signed)
[facility]    Date of Admission:  12/30/2016   Total days of antibiotics 5        Day5 azithromycin        Day 5 piptazo        Day 3 linezolid   ID: Cynthia Dillon is a 51 y.o. female with complicated hcap pneumonia with respiratory arrest Principal Problem:   Respiratory failure, acute (HCC) Active Problems:   DM (diabetes mellitus), type 2 with peripheral vascular complications (HCC)   Diabetic foot ulcer associated with diabetes mellitus due to underlying condition (HCC)   Multifocal pneumonia   Acute renal failure superimposed on stage 3 chronic kidney disease (HCC)   Cardiac arrest (HCC)    Subjective: Afebrile, now on supplemental oxygen.  cxr showing mild improvement  Medications:  . collagenase   Topical Daily  . furosemide  80 mg Intravenous Q12H  . gabapentin  600 mg Oral BID  . heparin  5,000 Units Subcutaneous Q8H  . insulin aspart  0-15 Units Subcutaneous Q4H  . ipratropium-albuterol  3 mL Nebulization Q6H  . [START ON 01/04/2017] levothyroxine  25 mcg Oral QAC breakfast  . mouth rinse  15 mL Mouth Rinse BID  . sodium chloride flush  3 mL Intravenous Q12H    Objective: Vital signs in last 24 hours: Temp:  [98.3 F (36.8 C)-99.3 F (37.4 C)] 98.5 F (36.9 C) (05/22 1615) Pulse Rate:  [87-114] 89 (05/22 1615) Resp:  [18-31] 25 (05/22 1615) BP: (110-141)/(52-93) 130/69 (05/22 1615) SpO2:  [90 %-100 %] 92 % (05/22 1615) Weight:  [182 lb (82.6 kg)] 182 lb (82.6 kg) (05/22 0400) Physical Exam  Constitutional:  oriented to person, place, and time. appears well-developed and well-nourished. No distress.  HENT: [place]/AT, PERRLA, no scleral icterus Mouth/Throat: Oropharynx is clear and moist. No oropharyngeal exudate.  Cardiovascular: Normal rate, regular rhythm and normal heart sounds. Exam reveals no gallop and no friction rub.  No murmur heard.  Pulmonary/Chest: Effort normal and breath sounds normal. No respiratory  distress.  has no wheezes.  Abdominal: Soft. Bowel sounds are normal.  exhibits no distension. There is no tenderness.  Neurological: alert and oriented to person, place, and time.  Skin: Skin is warm and dry. No rash noted. No erythema.  Psychiatric: a normal mood and affect.  behavior is normal.    Lab Results  Recent Labs  01/02/17 0230 01/03/17 0512  WBC 7.9 7.4  HGB 7.5* 8.2*  HCT 23.9* 26.1*  NA 133* 130*  K 3.8 3.5  CL 97* 93*  CO2 25 24  BUN 31* 34*  CREATININE 3.37* 3.33*   Liver Panel  Recent Labs  01/02/17 0230  PROT 5.9*  ALBUMIN 1.7*  AST 18  ALT 19  ALKPHOS 122  BILITOT 0.5    Microbiology: 5/28 blood cx ngtd, 5/18 urine cx ngtd, mrsa by pcr is negative Studies/Results: Dg Chest Port 1 View  Result Date: 01/03/2017 CLINICAL DATA:  Pneumonia. EXAM: PORTABLE CHEST 1 VIEW COMPARISON:  01/02/2017 .  CT 12/30/2016 FINDINGS: Mediastinum hilar structures normal. Heart size is stable. Diffuse bilateral pulmonary infiltrates and/or edema again noted. Small bilateral pleural effusions again noted. Chest is unchanged from prior exam . IMPRESSION: Diffuse bilateral pulmonary infiltrates and/or edema again noted. Small bilateral pleural effusions again noted. No significant interim change from prior exams. Electronically Signed   By: Maisie Fus  Register   On: 01/03/2017 07:35   Dg Chest Port 1 [address]  Result Date: 01/02/2017 CLINICAL DATA:  51 year old female with history of pneumonia. Shortness of breath. EXAM: PORTABLE CHEST 1 VIEW COMPARISON:  Chest x-ray 01/01/2017. FINDINGS: Lung volumes are normal. Extensive multifocal interstitial and airspace disease most confluent throughout the mid to lower lungs bilaterally. Moderate bilateral pleural effusions. Cephalization of the pulmonary vasculature. Mild cardiomegaly. The patient is rotated to the left on today's exam, resulting in distortion of the mediastinal contours and reduced diagnostic sensitivity and specificity for  mediastinal pathology. Atherosclerosis in the thoracic aorta. IMPRESSION: 1. The appearance the chest is very similar to prior studies. Based on comparison with recent CT examination, this presumably reflects a combination of severe multilobar pneumonia (most evident throughout the mid to lower lungs), superimposed upon a background of mild interstitial pulmonary edema from congestive heart failure, as above. 2. Aortic atherosclerosis. Electronically Signed   By: Trudie Reedaniel  Entrikin M.D.   On: 01/02/2017 07:14     Assessment/Plan: Hcap = surprisingly has had dramatic improvement over the last 48hr since her respiratory arrest on 5/18. continue with linezolid plus piptazo. Finishes azithromycin today. Plan to treat for a total of 7 days. Currently day 5.  Hypoxia = requiring less supplemental oxygen will continue to monitor  Chest discomfort = also improving, likely from CPR  [facility], LLCNIDER, [facility]NTH[facility] Cell: [phone] Pager: [phone]  01/03/2017, 5:46 PM

## 2017-01-04 LAB — CULTURE, BLOOD (ROUTINE X 2)
Culture: NO GROWTH
Culture: NO GROWTH
Special Requests: ADEQUATE
Special Requests: ADEQUATE

## 2017-01-04 LAB — BASIC METABOLIC PANEL
Anion gap: 12 (ref 5–15)
BUN: 35 mg/dL — ABNORMAL HIGH (ref 6–20)
CHLORIDE: 98 mmol/L — AB (ref 101–111)
CO2: 22 mmol/L (ref 22–32)
CREATININE: 3.58 mg/dL — AB (ref 0.44–1.00)
Calcium: 7.7 mg/dL — ABNORMAL LOW (ref 8.9–10.3)
GFR calc Af Amer: 16 mL/min — ABNORMAL LOW (ref >60)
GFR calc non Af Amer: 14 mL/min — ABNORMAL LOW (ref >60)
GLUCOSE: 199 mg/dL — AB (ref 65–99)
Potassium: 4 mmol/L (ref 3.5–5.1)
SODIUM: 132 mmol/L — AB (ref 135–145)

## 2017-01-04 LAB — PHOSPHORUS: Phosphorus: 4 mg/dL (ref 2.5–4.6)

## 2017-01-04 LAB — MAGNESIUM: Magnesium: 1.7 mg/dL (ref 1.7–2.4)

## 2017-01-04 LAB — GLUCOSE, CAPILLARY
GLUCOSE-CAPILLARY: 222 mg/dL — AB (ref 65–99)
Glucose-Capillary: 278 mg/dL — ABNORMAL HIGH (ref 65–99)

## 2017-01-04 MED ORDER — PRO-STAT SUGAR FREE PO LIQD
30.0000 mL | Freq: Two times a day (BID) | ORAL | Status: DC
  Administered 2017-01-04 – 2017-01-05 (×2): 30 mL via ORAL
  Filled 2017-01-04 (×7): qty 30

## 2017-01-04 MED ORDER — IPRATROPIUM-ALBUTEROL 0.5-2.5 (3) MG/3ML IN SOLN
3.0000 mL | Freq: Three times a day (TID) | RESPIRATORY_TRACT | Status: DC
  Administered 2017-01-04 – 2017-01-08 (×12): 3 mL via RESPIRATORY_TRACT
  Filled 2017-01-04 (×13): qty 3

## 2017-01-04 MED ORDER — ADULT MULTIVITAMIN W/MINERALS CH
1.0000 | ORAL_TABLET | Freq: Every day | ORAL | Status: DC
  Administered 2017-01-04 – 2017-01-08 (×5): 1 via ORAL
  Filled 2017-01-04 (×5): qty 1

## 2017-01-04 MED ORDER — INSULIN GLARGINE 100 UNIT/ML ~~LOC~~ SOLN
6.0000 [IU] | Freq: Every day | SUBCUTANEOUS | Status: DC
  Administered 2017-01-04 – 2017-01-05 (×2): 6 [IU] via SUBCUTANEOUS
  Filled 2017-01-04 (×2): qty 0.06

## 2017-01-04 MED ORDER — OXYCODONE HCL 5 MG PO TABS
5.0000 mg | ORAL_TABLET | Freq: Four times a day (QID) | ORAL | Status: DC | PRN
  Administered 2017-01-04 – 2017-01-07 (×9): 5 mg via ORAL
  Filled 2017-01-04 (×9): qty 1

## 2017-01-04 MED ORDER — FUROSEMIDE 10 MG/ML IJ SOLN
40.0000 mg | Freq: Two times a day (BID) | INTRAMUSCULAR | Status: DC
  Administered 2017-01-04: 40 mg via INTRAVENOUS
  Filled 2017-01-04: qty 4

## 2017-01-04 NOTE — Progress Notes (Signed)
Nutrition Follow-up  DOCUMENTATION CODES:   Obesity unspecified  INTERVENTION:   -MVI daily -30 ml Prostat BID, each supplement provides 100 kcals and 15 grams protein  NUTRITION DIAGNOSIS:   Increased nutrient needs related to wound healing as evidenced by estimated needs.  Progressing  GOAL:   Patient will meet greater than or equal to 90% of their needs  Progressing  MONITOR:   PO intake, Labs, Weight trends, Skin, I & O's  REASON FOR ASSESSMENT:   Consult  (uncontrolled DM with foot ulcer)  ASSESSMENT:   51 year old female with extensive PMH primarily uncontrolled diabetes for decades who has had an amputation of the left leg as well as vision loss who presented with SOB starting on Tuesday 8/15 and a CT was done that showed pneumonia and was started on broad spectrum abx that was changed during the four day course x2 for "lack of response"  After 4 days of abx the patient [facility] and pulmonary to see due to ongoing hypoxemia and lack of response to treatment.   5/18- code blue, cardiac arrest hypoxia related 6 minutes to ROSC  Spoke with pt, who reports feeling better. She states her appetite is improving, however, is frustrated that she is not receiving the items that she ordered. Pt shares that she has been consuming a lot of fruit lately- ordered a cottage cheese and fresh fruit plate for lunch. Pt complained that some food items are very hard; offered to downgrade diet to receive softer texture foods, however, pt politely declined. Obtained food preferences to update meal ordering system.   Noted MVI was discontinued on 12/30/16, due to nausea. Pt reports her nausea has improved and is taking medications without difficulty.   Discussed importance of good meal completion to promote healing.  Labs reviewed: CBGS: 210-285.   Diet Order:  Diet Carb Modified Fluid consistency: Thin; Room service appropriate? Yes  Skin:  Wound (see comment) (lt  foot DM ulcer)  Last BM:  01/03/17  Height:   Ht Readings from Last 1 Encounters:  12/30/16 5\' 4"  (1.626 m)    Weight:   Wt Readings from Last 1 Encounters:  01/03/17 182 lb (82.6 kg)    Ideal Body Weight:  54.5 kg  BMI:  Body mass index is 31.24 kg/m.  Estimated Nutritional Needs:   Kcal:  1650-1850  Protein:  80-95 grams  Fluid:  >1.6 L  EDUCATION NEEDS:   Education needs addressed  Eual Lindstrom A. Mayford KnifeWilliams, RD, LDN, CDE Pager: [phone] After hours Pager: [phone]

## 2017-01-04 NOTE — Progress Notes (Signed)
[facility]    Date of Admission:  12/30/2016   Total days of antibiotics 6        Day 6piptazo        Day 4 linezolid   ID: Cynthia Dillon is a 51 y.o. female with complicated hcap pneumonia with respiratory arrest Principal Problem:   Respiratory failure, acute (HCC) Active Problems:   DM (diabetes mellitus), type 2 with peripheral vascular complications (HCC)   Diabetic foot ulcer associated with diabetes mellitus due to underlying condition (HCC)   Multifocal pneumonia   Acute renal failure superimposed on stage 3 chronic kidney disease (HCC)   Cardiac arrest (HCC)    Subjective: Isolated fever of 101.1 F last night but reports being asymptomatic. Feeling good  Medications:  . collagenase   Topical Daily  . feeding supplement (PRO-STAT SUGAR FREE 64)  30 mL Oral BID  . furosemide  40 mg Intravenous Q12H  . gabapentin  600 mg Oral BID  . heparin  5,000 Units Subcutaneous Q8H  . insulin aspart  0-15 Units Subcutaneous Q4H  . insulin glargine  6 Units Subcutaneous Daily  . ipratropium-albuterol  3 mL Nebulization TID  . levothyroxine  25 mcg Oral QAC breakfast  . mouth rinse  15 mL Mouth Rinse BID  . multivitamin with minerals  1 tablet Oral Daily  . sodium chloride flush  3 mL Intravenous Q12H    Objective: Vital signs in last 24 hours: Temp:  [98.1 F (36.7 C)-101.1 F (38.4 C)] 98.4 F (36.9 C) (05/23 1500) BP: (128)/(70) 128/70 (05/22 2016) SpO2:  [93 %-97 %] 97 % (05/23 0758) Physical Exam  Constitutional:  oriented to person, place, and time. appears well-developed and well-nourished. No distress. sitting on bedside commode  HENT: [place]/AT, PERRLA, no scleral icterus Mouth/Throat: Oropharynx is clear and moist. No oropharyngeal exudate.  Cardiovascular: Normal rate, regular rhythm and normal heart sounds. Exam reveals no gallop and no friction rub.  No murmur heard.  Pulmonary/Chest: Effort normal and breath sounds normal.  No respiratory distress.  has no wheezes.  Abdominal: Soft. Bowel sounds are normal.  exhibits no distension. There is no tenderness.  Skin: Skin is warm and dry. No rash noted. No erythema.    Lab Results  Recent Labs  01/02/17 0230 01/03/17 0512 01/04/17 1008  WBC 7.9 7.4  --   HGB 7.5* 8.2*  --   HCT 23.9* 26.1*  --   NA 133* 130* 132*  K 3.8 3.5 4.0  CL 97* 93* 98*  CO2 25 24 22   BUN 31* 34* 35*  CREATININE 3.37* 3.33* 3.58*   Liver Panel  Recent Labs  01/02/17 0230  PROT 5.9*  ALBUMIN 1.7*  AST 18  ALT 19  ALKPHOS 122  BILITOT 0.5    Microbiology: 5/28 blood cx ngtd, 5/18 urine cx ngtd, mrsa by pcr is negative Studies/Results: Dg Chest Port 1 View  Result Date: 01/03/2017 CLINICAL DATA:  Pneumonia. EXAM: PORTABLE CHEST 1 VIEW COMPARISON:  01/02/2017 .  CT 12/30/2016 FINDINGS: Mediastinum hilar structures normal. Heart size is stable. Diffuse bilateral pulmonary infiltrates and/or edema again noted. Small bilateral pleural effusions again noted. Chest is unchanged from prior exam . IMPRESSION: Diffuse bilateral pulmonary infiltrates and/or edema again noted. Small bilateral pleural effusions again noted. No significant interim change from prior exams. Electronically Signed   By: Maisie Fushomas  Register   On: 01/03/2017 07:35     Assessment/Plan: Hcap = doing well despite isolated  fever. continue with linezolid plus piptazo.Plan to treat for a total of 7 days. Currently day 6.  Fever = if repeat fever, would recommend to repeat blood cx, she appears non toxic and overall, improving  Hypoxia = requiring less supplemental oxygen will continue to monitor   [facility], [facility] for Infectious Diseases Cell: [phone] Pager: [phone]  01/04/2017, 4:15 PM

## 2017-01-04 NOTE — Progress Notes (Signed)
Pt ambulated 300 ft on O2 @ 4L/[place] w/sats 94-96%.  She then continued another 3700ft on O2 @ 3L/[place] w/sats 92%.  Pt exhibited no SOB; no DOE; RR 16-20; VSS.  Pt talked to RN throughout entire process w/out struggling.  She returned to sit upright in bedside chair. When oxygen was disconnected from mobile tank and reattached to the wall unit, pt sats dropped to 81% on RA almost immediately.  Pt able to recover sats to 94% on 3L/[place].  MD to be notified.

## 2017-01-04 NOTE — Care Management Note (Addendum)
Case Management Note  Patient Details  Name: Cynthia Dillon MRN: [id number] Date of Birth: November 11, 1965  Subjective/Objective:  Pt admitted with Resp issues - requiring non rebreather                   Action/Plan:   Pt is from home - unable to assess due to current resp issues.  CM and CSW consulted for Multiple Medical Problems.  CM will need to follow up on insurance, PCP and prescription coverage once medically stable    Expected Discharge Date:                  Expected Discharge Plan:     In-House Referral:  Clinical Social Work  Discharge planning Services  CM Consult  Post Acute Care Choice:    Choice offered to:     DME Arranged:    DME Agency:     HH Arranged:    HH Agency:     Status of Service:     If discussed at [employer] Meetings, dates discussed:    Additional Comments: Pt alert and oriented - from home independent from home with husband.  CM attempted to contact son regarding work note however unable to reach.  Pt medicaid recently approved - per [place] [facility] [place] - pt will be able to purchase medications with medicaid.  Pt states she doesn't need HHPT. [name], Breunna Nordmann S, RN 01/04/2017, 10:54 AM

## 2017-01-04 NOTE — Progress Notes (Addendum)
PROGRESS NOTE    Cynthia Dillon  ZOX:[id number] DOB: April 15, 1966 DOA: 12/30/2016 PCP: System, Pcp Not In   Brief Narrative:  51 year old female with poorly controlled diabetes, coronary artery disease status post MI, transmetatarsal amputation came to [place] last week with complaints of nonproductive cough, fevers and chills along with hypoxia. CAT scan showed pneumonia therefore started on Levaquin and amoxicillin. Clinical status continued to worsen therefore antibiotics were changed to vancomycin and Zosyn. Due to acute renal failure she was switched to imipenem. Initially [facility] she was noted to elevated BNP. C. difficile was checked which was negative. Unfortunately during her stay she had a cardiac arrest/PEA therefore CPR was performed and return of spontaneous circulation after 6 minutes. She was transferred to the ICU for closer monitoring.   Assessment & Plan:   Principal Problem:   Respiratory failure, acute (HCC) Active Problems:   DM (diabetes mellitus), type 2 with peripheral vascular complications (HCC)   Diabetic foot ulcer associated with diabetes mellitus due to underlying condition (HCC)   Multifocal pneumonia   Acute renal failure superimposed on stage 3 chronic kidney disease (HCC)   Cardiac arrest (HCC)  Acute hypoxic respiratory failure - Multifactorial, secondary to pneumonia, and volume overload. - Patient reports she has home oxygen, but did not require treated for the last 3-4 month - Even though she reports symptomatically improvement, she has increased demand of oxygen yesterday was 5 L nasal cannula, today she is on 8 L high flow nasal cannula. - Repeat chest x-ray in a.m..  HCAP - Antibiotic management per ID, discussed with ID today,  they will asses patient today given she is still febrile - Continue Zosyn and Linezolid; discontinue azithromycin today  Acute diastolic CHF - Chest x-ray with evidence of small bilateral pleural effusion, and  edema, 2D echo with a grade 2 diastolic CHF, continue with IV diuresis. - She is -4.068 mL since admission, with pump in creatinine today to 3.5 today, so will decrease her Lasix dose from 80-40 mg twice a day.  Coronary artery disease - Status postcardiac arrest on 12/30/2016, she tells me she had another episode of cardiac arrest at the end of hospital few month ago secondary to her foot infection, will obtain records. - Continue telemetry monitoring - Echocardiogram does not show any wall motion abnormality.  CKG stage IV -Avoid nephrotoxic drugs. Closely monitor renal function -Will need outpatient follow-up with nephrology -Monitor urine output  Uncontrolled diabetes type 2 complicated by diabetic foot ulcer and neuropathy -A1c 9.8 on 12/30/2016 -Continue sliding scale -Blood glucose acceptable range in last 24 hours except 1 high reading. Will monitor closely  Anemia of chronic disease -  No need for transfusion at this time. No signs of active bleeding  Diabetes mellitus - CBG remains uncontrolled on insulin sliding scale, will start on low-dose Lantus  Left foot wound - Wound Care input appreciated  DVT prophylaxis: Subcutaneous heparin Code Status: Full Family Communication:  None at that Disposition Plan: Pending with further workup, and improvement of oxygen requirement  Consultants:   Pulmonary  Infectious disease   Subjective: Reports breathing is much better today, denies any cough or productive sputum , febrile 101.1 overnight, she remains on 8 L nasal cannula today . Objective: Vitals:   01/04/17 0000 01/04/17 0255 01/04/17 0600 01/04/17 0758  BP:      Pulse:      Resp:      Temp: (!) 101.1 F (38.4 C)  99.3 F (37.4 C) 98.6  F (37 C)  TempSrc: Oral  Oral Oral  SpO2:  93%  97%  Weight:      Height:        Intake/Output Summary (Last 24 hours) at 01/04/17 1001 Last data filed at 01/03/17 2337  Gross per 24 hour  Intake              940 ml    Output             1101 ml  Net             -161 ml   Filed Weights   01/01/17 0500 01/02/17 0500 01/03/17 0400  Weight: 85.7 kg (188 lb 15 oz) 83.1 kg (183 lb 3.2 oz) 82.6 kg (182 lb)    Examination:  Awake alert oriented 3, in no apparent distress Good air entry bilaterally, negative accessory muscle, mild bibasilar crackles. S1& S2 heard, no rubs murmurs gallops,,RRR Abdomen soft, nontender, nondistended, bowel sounds present Left foot with transmetatarsal amputation, wound bandaged    Data Reviewed:   CBC:  Recent Labs Lab 12/30/16 1254  12/31/16 0037 01/01/17 0325 01/01/17 1552 01/02/17 0230 01/03/17 0512  WBC 8.6  < > 9.0 7.5 8.4 7.9 7.4  NEUTROABS 5.6  --   --   --   --   --  5.4  HGB 7.1*  < > 7.1* 6.1* 7.6* 7.5* 8.2*  HCT 23.1*  < > 22.1* 19.2* 23.7* 23.9* 26.1*  MCV 83.7  < > 83.1 81.4 82.6 83.0 83.1  PLT 362  < > 321 290 296 310 307  < > = values in this interval not displayed. Basic Metabolic Panel:  Recent Labs Lab 12/30/16 1254 12/30/16 2000 12/31/16 0037 12/31/16 1610 01/01/17 0325 01/02/17 0230 01/03/17 0512  NA 130* 132* 134*  --  132* 133* 130*  K 4.7 4.8 5.0  --  3.9 3.8 3.5  CL 103 103 105  --  98* 97* 93*  CO2 17* 19* 19*  --  22 25 24   GLUCOSE 163* 121* 142*  --  225* 120* 121*  BUN 29* 29* 28*  --  31* 31* 34*  CREATININE 3.51* 3.48* 3.60*  --  3.69* 3.37* 3.33*  CALCIUM 7.7* 7.8* 7.8*  --  7.3* 7.4* 7.5*  MG 1.7 1.7  --  1.9  --   --  1.6*  PHOS  --  3.9  --  4.8*  --   --  3.9   GFR: Estimated Creatinine Clearance: 21 mL/min (A) (by C-G formula based on SCr of 3.33 mg/dL (H)). Liver Function Tests:  Recent Labs Lab 12/30/16 1254 01/02/17 0230  AST 23 18  ALT 19 19  ALKPHOS 154* 122  BILITOT 0.6 0.5  PROT 6.7 5.9*  ALBUMIN 2.2* 1.7*   No results for input(s): LIPASE, AMYLASE in the last 168 hours. No results for input(s): AMMONIA in the last 168 hours. Coagulation Profile:  Recent Labs Lab 12/30/16 1254  INR  1.17   Cardiac Enzymes:  Recent Labs Lab 12/30/16 1903 12/31/16 0037 12/31/16 0635  TROPONINI 0.04* 0.05* 0.06*   BNP (last 3 results) No results for input(s): PROBNP in the last 8760 hours. HbA1C: No results for input(s): HGBA1C in the last 72 hours. CBG:  Recent Labs Lab 01/03/17 0758 01/03/17 1134 01/03/17 1618 01/03/17 2010 01/03/17 2331  GLUCAP 112* 285* 210* 242* 222*   Lipid Profile: No results for input(s): CHOL, HDL, LDLCALC, TRIG, CHOLHDL, LDLDIRECT in the last 72 hours.  Thyroid Function Tests: No results for input(s): TSH, T4TOTAL, FREET4, T3FREE, THYROIDAB in the last 72 hours. Anemia Panel: No results for input(s): VITAMINB12, FOLATE, FERRITIN, TIBC, IRON, RETICCTPCT in the last 72 hours. Sepsis Labs:  Recent Labs Lab 12/30/16 1254 12/30/16 1302 12/31/16 0037 01/01/17 0325 01/03/17 0512  PROCALCITON 0.79  --  1.01 14.13 20.99  LATICACIDVEN  --  0.8 1.0  --   --     Recent Results (from the past 240 hour(s))  Culture, blood (routine x 2) Call MD if unable to obtain prior to antibiotics being given     Status: None (Preliminary result)   Collection Time: 12/30/16 12:54 PM  Result Value Ref Range Status   Specimen Description BLOOD RIGHT ARM  Final   Special Requests IN PEDIATRIC BOTTLE Blood Culture adequate volume  Final   Culture NO GROWTH 4 DAYS  Final   Report Status PENDING  Incomplete  Culture, blood (routine x 2) Call MD if unable to obtain prior to antibiotics being given     Status: None (Preliminary result)   Collection Time: 12/30/16 12:54 PM  Result Value Ref Range Status   Specimen Description BLOOD RIGHT HAND  Final   Special Requests IN PEDIATRIC BOTTLE Blood Culture adequate volume  Final   Culture NO GROWTH 4 DAYS  Final   Report Status PENDING  Incomplete  Urine culture     Status: None   Collection Time: 12/30/16  4:36 PM  Result Value Ref Range Status   Specimen Description URINE, RANDOM  Final   Special Requests NONE   Final   Culture NO GROWTH  Final   Report Status 12/31/2016 FINAL  Final  Urine culture     Status: None   Collection Time: 12/30/16  8:42 PM  Result Value Ref Range Status   Specimen Description URINE, CLEAN CATCH  Final   Special Requests Immunocompromised  Final   Culture NO GROWTH  Final   Report Status 01/01/2017 FINAL  Final  MRSA PCR Screening     Status: None   Collection Time: 12/30/16  8:44 PM  Result Value Ref Range Status   MRSA by PCR NEGATIVE NEGATIVE Final    Comment:        The GeneXpert MRSA Assay (FDA approved for NASAL specimens only), is one component of a comprehensive MRSA colonization surveillance program. It is not intended to diagnose MRSA infection nor to guide or monitor treatment for MRSA infections.   Respiratory Panel by PCR     Status: None   Collection Time: 12/31/16  4:50 PM  Result Value Ref Range Status   Adenovirus NOT DETECTED NOT DETECTED Final   Coronavirus 229E NOT DETECTED NOT DETECTED Final   Coronavirus HKU1 NOT DETECTED NOT DETECTED Final   Coronavirus NL63 NOT DETECTED NOT DETECTED Final   Coronavirus OC43 NOT DETECTED NOT DETECTED Final   Metapneumovirus NOT DETECTED NOT DETECTED Final   Rhinovirus / Enterovirus NOT DETECTED NOT DETECTED Final   Influenza A NOT DETECTED NOT DETECTED Final   Influenza B NOT DETECTED NOT DETECTED Final   Parainfluenza Virus 1 NOT DETECTED NOT DETECTED Final   Parainfluenza Virus 2 NOT DETECTED NOT DETECTED Final   Parainfluenza Virus 3 NOT DETECTED NOT DETECTED Final   Parainfluenza Virus 4 NOT DETECTED NOT DETECTED Final   Respiratory Syncytial Virus NOT DETECTED NOT DETECTED Final   Bordetella pertussis NOT DETECTED NOT DETECTED Final   Chlamydophila pneumoniae NOT DETECTED NOT DETECTED Final   Mycoplasma pneumoniae  NOT DETECTED NOT DETECTED Final         Radiology Studies: Dg Chest Port 1 View  Result Date: 01/03/2017 CLINICAL DATA:  Pneumonia. EXAM: PORTABLE CHEST 1 VIEW COMPARISON:   01/02/2017 .  CT 12/30/2016 FINDINGS: Mediastinum hilar structures normal. Heart size is stable. Diffuse bilateral pulmonary infiltrates and/or edema again noted. Small bilateral pleural effusions again noted. Chest is unchanged from prior exam . IMPRESSION: Diffuse bilateral pulmonary infiltrates and/or edema again noted. Small bilateral pleural effusions again noted. No significant interim change from prior exams. Electronically Signed   By: Maisie Fus  Register   On: 01/03/2017 07:35        Scheduled Meds: . collagenase   Topical Daily  . furosemide  80 mg Intravenous Q12H  . gabapentin  600 mg Oral BID  . heparin  5,000 Units Subcutaneous Q8H  . insulin aspart  0-15 Units Subcutaneous Q4H  . ipratropium-albuterol  3 mL Nebulization TID  . levothyroxine  25 mcg Oral QAC breakfast  . mouth rinse  15 mL Mouth Rinse BID  . sodium chloride flush  3 mL Intravenous Q12H   Continuous Infusions: . linezolid (ZYVOX) IV 600 mg (01/04/17 0915)  . piperacillin-tazobactam (ZOSYN)  IV 3.375 g (01/04/17 0640)     LOS: 5 days     Huey Bienenstock, MD [facility] Pager [phone]  If 7PM-7AM, please contact night-coverage [email or web address] Password [email or web address] 01/04/2017, 10:01 AM

## 2017-01-04 NOTE — Progress Notes (Addendum)
Inpatient Diabetes Program Recommendations  AACE/ADA: New Consensus Statement on Inpatient Glycemic Control (2015)  Target Ranges:  Prepandial:   less than 140 mg/dL      Peak postprandial:   less than 180 mg/dL (1-2 hours)      Critically ill patients:  140 - 180 mg/dL   Lab Results  Component Value Date   GLUCAP 222 (H) 01/03/2017   HGBA1C 9.8 (H) 12/30/2016    Review of Glycemic Control Results for Cynthia Dillon, Cynthia Dillon (MRN [id number]) as of 01/04/2017 13:11  Ref. Range 01/03/2017 07:58 01/03/2017 11:34 01/03/2017 16:18 01/03/2017 20:10 01/03/2017 23:31  Glucose-Capillary Latest Ref Range: 65 - 99 mg/dL 563112 (H) 875285 (H) 643210 (H) 242 (H) 222 (H)   Inpatient Diabetes Program Recommendations:    Noted postprandial CBGs elevated.  Please consider: -Novolog 4 units tid meal coverage if eats 50%   Spoke with pt @ bedside about A1C results 9.8 (average BG 235) and explained what an A1C is, basic pathophysiology of DM Type 2, basic home care, basic diabetes diet nutrition principles, importance of checking CBGs and maintaining good CBG control to prevent long-term and short-term complications. Reviewed signs and symptoms of hyperglycemia and hypoglycemia and how to treat hypoglycemia at home. Also reviewed blood sugar goals at home.  RNs to provide ongoing basic DM education at bedside with this patient.  Patient states she takes her insulin regularly 2 times daily post meal per instructions of her MD. Will follow.  Thank you, Billy FischerJudy E. Tegan Burnside, RN, MSN, CDE  Diabetes Coordinator Inpatient Glycemic Control Team Team Pager [phone] (8am-5pm) 01/04/2017 1:20 PM

## 2017-01-05 ENCOUNTER — Inpatient Hospital Stay (HOSPITAL_COMMUNITY): Payer: Medicaid Other

## 2017-01-05 LAB — GLUCOSE, CAPILLARY
GLUCOSE-CAPILLARY: 144 mg/dL — AB (ref 65–99)
Glucose-Capillary: 312 mg/dL — ABNORMAL HIGH (ref 65–99)

## 2017-01-05 LAB — CBC
HCT: 25.9 % — ABNORMAL LOW (ref 36.0–46.0)
Hemoglobin: 8.1 g/dL — ABNORMAL LOW (ref 12.0–15.0)
MCH: 26.3 pg (ref 26.0–34.0)
MCHC: 31.3 g/dL (ref 30.0–36.0)
MCV: 84.1 fL (ref 78.0–100.0)
Platelets: 328 10*3/uL (ref 150–400)
RBC: 3.08 MIL/uL — AB (ref 3.87–5.11)
RDW: 16.1 % — ABNORMAL HIGH (ref 11.5–15.5)
WBC: 7.7 10*3/uL (ref 4.0–10.5)

## 2017-01-05 LAB — BASIC METABOLIC PANEL
Anion gap: 10 (ref 5–15)
BUN: 36 mg/dL — ABNORMAL HIGH (ref 6–20)
CHLORIDE: 97 mmol/L — AB (ref 101–111)
CO2: 23 mmol/L (ref 22–32)
CREATININE: 3.49 mg/dL — AB (ref 0.44–1.00)
Calcium: 7.8 mg/dL — ABNORMAL LOW (ref 8.9–10.3)
GFR calc non Af Amer: 14 mL/min — ABNORMAL LOW (ref >60)
GFR, EST AFRICAN AMERICAN: 17 mL/min — AB (ref >60)
Glucose, Bld: 134 mg/dL — ABNORMAL HIGH (ref 65–99)
Potassium: 4.6 mmol/L (ref 3.5–5.1)
SODIUM: 130 mmol/L — AB (ref 135–145)

## 2017-01-05 MED ORDER — INSULIN ASPART 100 UNIT/ML ~~LOC~~ SOLN
0.0000 [IU] | Freq: Three times a day (TID) | SUBCUTANEOUS | Status: DC
  Administered 2017-01-05: 8 [IU] via SUBCUTANEOUS
  Administered 2017-01-05: 11 [IU] via SUBCUTANEOUS
  Administered 2017-01-06: 8 [IU] via SUBCUTANEOUS
  Administered 2017-01-06: 5 [IU] via SUBCUTANEOUS
  Administered 2017-01-06 – 2017-01-07 (×2): 3 [IU] via SUBCUTANEOUS
  Administered 2017-01-07 (×2): 8 [IU] via SUBCUTANEOUS
  Administered 2017-01-08: 5 [IU] via SUBCUTANEOUS

## 2017-01-05 MED ORDER — FUROSEMIDE 10 MG/ML IJ SOLN
80.0000 mg | Freq: Two times a day (BID) | INTRAMUSCULAR | Status: DC
  Administered 2017-01-05 (×2): 80 mg via INTRAVENOUS
  Filled 2017-01-05 (×2): qty 8

## 2017-01-05 MED ORDER — INSULIN GLARGINE 100 UNIT/ML ~~LOC~~ SOLN
4.0000 [IU] | Freq: Once | SUBCUTANEOUS | Status: AC
  Administered 2017-01-05: 4 [IU] via SUBCUTANEOUS
  Filled 2017-01-05 (×2): qty 0.04

## 2017-01-05 MED ORDER — GABAPENTIN 300 MG PO CAPS
600.0000 mg | ORAL_CAPSULE | Freq: Every day | ORAL | Status: DC
  Administered 2017-01-06 – 2017-01-08 (×3): 600 mg via ORAL
  Filled 2017-01-05 (×3): qty 2

## 2017-01-05 MED ORDER — METOPROLOL TARTRATE 12.5 MG HALF TABLET
12.5000 mg | ORAL_TABLET | Freq: Two times a day (BID) | ORAL | Status: DC
  Administered 2017-01-05 – 2017-01-08 (×7): 12.5 mg via ORAL
  Filled 2017-01-05 (×7): qty 1

## 2017-01-05 MED ORDER — INSULIN GLARGINE 100 UNIT/ML ~~LOC~~ SOLN
10.0000 [IU] | Freq: Every day | SUBCUTANEOUS | Status: DC
  Administered 2017-01-06: 10 [IU] via SUBCUTANEOUS
  Filled 2017-01-05: qty 0.1

## 2017-01-05 NOTE — Progress Notes (Signed)
Pharmacy Antibiotic Note  Cynthia Dillon is a 51 y.o. female [facility]. Patient is currently on Zosyn per pharmacy dosing. Zyvox added 5/20 for new fever and increased PCT. Course of azithromycin completed. SCr remains elevated at 3.47 with CrCl ~20   Plan: - Continue Zosyn 2.25g IV every 8 hours - Follow clinical progression, renal function, c/s, ID recs  Height: 5\' 4"  (162.6 cm) Weight: 178 lb 9.2 oz (81 kg) IBW/kg (Calculated) : 54.7  Temp (24hrs), Avg:99.3 F (37.4 C), Min:98.1 F (36.7 C), Max:100.3 F (37.9 C)   Recent Labs Lab 12/30/16 1302 12/30/16 1434  12/31/16 0037 01/01/17 0325 01/01/17 1552 01/02/17 0230 01/03/17 0512 01/04/17 1008 01/05/17 0218  WBC  --   --   < > 9.0 7.5 8.4 7.9 7.4  --  7.7  CREATININE  --   --   < > 3.60* 3.69*  --  3.37* 3.33* 3.58* 3.49*  LATICACIDVEN 0.8  --   --  1.0  --   --   --   --   --   --   VANCORANDOM  --  17  --   --   --   --   --   --   --   --   < > = values in this interval not displayed.  Estimated Creatinine Clearance: 19.8 mL/min (A) (by C-G formula based on SCr of 3.49 mg/dL (H)).    Allergies  Allergen Reactions  . Latex Rash    Antimicrobials this admission: 5/15 Levaquin >> 5/16 5/16 Vanc x 1, resume 5/18 x1 5/18 Primaxin x1 Zosyn 5/16 >> Azith 5/18 >>5/22 Zyvox 5/20 >>  Dose adjustments this admission: 5/18: vancomycin 1g IV q48h ordered in response to VR of 2317mcg/mL  Microbiology results: 5/18 BCx2: ngtd 5/18 UCx: neg 5/18 MRSA PCR: neg 5/18 Strep pneumo- neg 5/18 Legionella - neg 5/19 Resp PCR: neg 5/20 Legionella - neg  Thank you for allowing pharmacy to be a part of this patient's care.  Gwyndolyn KaufmanKai Anzley Dibbern Bernette Redbird(Kenny), PharmD  PGY1 Pharmacy Resident Pager: [phone] 01/05/2017 10:47 AM

## 2017-01-05 NOTE — Progress Notes (Signed)
Inpatient Diabetes Program Recommendations  AACE/ADA: New Consensus Statement on Inpatient Glycemic Control (2015)  Target Ranges:  Prepandial:   less than 140 mg/dL      Peak postprandial:   less than 180 mg/dL (1-2 hours)      Critically ill patients:  140 - 180 mg/dL   Lab Results  Component Value Date   GLUCAP 144 (H) 01/05/2017   HGBA1C 9.8 (H) 12/30/2016    Review of Glycemic Control  Inpatient Diabetes Program Recommendations:    Noted postprandial CBGs elevated.  Please consider: -Novolog 4 units tid meal coverage if eats 50%  Thank you, Darel HongJudy E. Monia Timmers, RN, MSN, CDE  Diabetes Coordinator Inpatient Glycemic Control Team Team Pager [phone] (8am-5pm) 01/05/2017 1:08 PM

## 2017-01-05 NOTE — Plan of Care (Signed)
Problem: Health Behavior/Discharge Planning: Goal: Ability to manage health-related needs will improve Outcome: Progressing Wants to go home,  Home o2 will need to be set up for 4lpm  Problem: Skin Integrity: Goal: Risk for impaired skin integrity will decrease Outcome: Progressing No new PU  Problem: Activity: Goal: Risk for activity intolerance will decrease Outcome: Not Progressing Walked today on 4l, tolerated well,, no drop in saturation as long as o2 on  Problem: Nutrition: Goal: Adequate nutrition will be maintained Outcome: Completed/Met Date Met: 01/05/17 Taking protein supplement and diet without issues  Problem: Bowel/Gastric: Goal: Will not experience complications related to bowel motility Outcome: Progressing stooling regularly  Problem: Respiratory: Goal: Pain level will decrease Outcome: Progressing Using pain medicine prn for aches foot and head Goal: Identification of resources available to assist in meeting health care needs will improve Outcome: Progressing CM following with patient.

## 2017-01-05 NOTE — Progress Notes (Signed)
[facility]    Date of Admission:  12/30/2016   Total days of antibiotics 7        Day 7piptazo        Day 5 linezolid   ID: Cynthia Dillon is a 51 y.o. female with complicated hcap pneumonia with respiratory arrest Principal Problem:   Respiratory failure, acute (HCC) Active Problems:   DM (diabetes mellitus), type 2 with peripheral vascular complications (HCC)   Diabetic foot ulcer associated with diabetes mellitus due to underlying condition (HCC)   Multifocal pneumonia   Acute renal failure superimposed on stage 3 chronic kidney disease (HCC)   Cardiac arrest (HCC)    Subjective: reports being asymptomatic though still having fever t max of 100.3. No leukocytosis. She is doing well except when she ambulates and desaturates to the 80s on 4LNC  Medications:  . collagenase   Topical Daily  . feeding supplement (PRO-STAT SUGAR FREE 64)  30 mL Oral BID  . furosemide  80 mg Intravenous Q12H  . [START ON 01/06/2017] gabapentin  600 mg Oral Daily  . heparin  5,000 Units Subcutaneous Q8H  . insulin aspart  0-15 Units Subcutaneous TID WC  . [START ON 01/06/2017] insulin glargine  10 Units Subcutaneous Daily  . ipratropium-albuterol  3 mL Nebulization TID  . mouth rinse  15 mL Mouth Rinse BID  . metoprolol tartrate  12.5 mg Oral BID  . multivitamin with minerals  1 tablet Oral Daily  . sodium chloride flush  3 mL Intravenous Q12H    Objective: Vital signs in last 24 hours: Temp:  [98.5 F (36.9 C)-100.3 F (37.9 C)] 98.5 F (36.9 C) (05/24 1353) Pulse Rate:  [88-105] 98 (05/24 1353) Resp:  [22-31] 22 (05/24 1353) BP: (96-132)/(46-69) 112/60 (05/24 1353) SpO2:  [87 %-98 %] 91 % (05/24 1353) FiO2 (%):  [91 %] 91 % (05/24 2009) Weight:  [178 lb 9.2 oz (81 kg)] 178 lb 9.2 oz (81 kg) (05/24 0500) Physical Exam  Constitutional:  oriented to person, place, and time. appears well-developed and well-nourished. Easily awakens HENT: [place]/AT,  PERRLA, no scleral icterus Mouth/Throat: Oropharynx is clear and moist. No oropharyngeal exudate.  Cardiovascular: Normal rate, regular rhythm and normal heart sounds. Exam reveals no gallop and no friction rub.  No murmur heard.  Pulmonary/Chest: Effort normal and breath sounds normal. No respiratory distress.  has no wheezes.  Skin: Skin is warm and dry. No rash noted. No erythema.    Lab Results  Recent Labs  01/03/17 0512 01/04/17 1008 01/05/17 0218  WBC 7.4  --  7.7  HGB 8.2*  --  8.1*  HCT 26.1*  --  25.9*  NA 130* 132* 130*  K 3.5 4.0 4.6  CL 93* 98* 97*  CO2 24 22 23   BUN 34* 35* 36*  CREATININE 3.33* 3.58* 3.49*   Liver Panel No results for input(s): PROT, ALBUMIN, AST, ALT, ALKPHOS, BILITOT, BILIDIR, IBILI in the last 72 hours.  Microbiology: 5/28 blood cx ngtd, 5/18 urine cx ngtd, mrsa by pcr is negative Studies/Results: Dg Chest Port 1 View  Result Date: 01/05/2017 CLINICAL DATA:  51 year old female with a history of respiratory failure EXAM: PORTABLE CHEST 1 VIEW COMPARISON:  01/03/2017, 01/02/2017 FINDINGS: Cardiomediastinal silhouette unchanged. Low lung volumes with opacities at the bilateral lung bases, slightly improved from the prior. Mild interlobular septal thickening bilaterally. Fullness in the central vascular. No pneumothorax. Obscuration of the left and right hemidiaphragms as well as  the left heart border. No displaced fracture IMPRESSION: Slight improvement of bibasilar opacities, likely combination of edema, pleural effusion, atelectasis/consolidation. Electronically Signed   By: Gilmer Mor D.O.   On: 01/05/2017 07:41     Assessment/Plan: Hcap = doing well despite isolated fever. .Plan to treat for a total of 7 days, today being the last day  Fever = overall fever curve improving. she appears non toxic. Continue to monitor vitals  Hypoxia = requiring 4LNC at rest, agree with dr elgergawy that she may have element of sleep apnea to explain  early morning hypoxia.   Discussed treatment plan with dr elgergawy   [facility] for Infectious Diseases Cell: [phone] Pager: [phone]  01/05/2017, 8:29 PM

## 2017-01-05 NOTE — Progress Notes (Signed)
Physical Therapy Treatment Patient Details Name: Cynthia DenseShiela Ann Martin Fantroy MRN: [id number] DOB: July 21, 1966 Today's Date: 01/05/2017    History of Present Illness 51 yo admitted from [facility] with PNA and respiratory difficulty. After transport to CT pt found unresponsive and hypoxic, code blue 5/18. PMhx: DM, left transmet, CKD, neuropathy, HTN, anemia    PT Comments    Patient does not seem like animated self today. This PT walked into pt's room and she was on RA, unable to get accurate pulse ox reading (reading at 35%). Pt with some mild staggering and LOb x2 during gait training reporting dizziness so therapist had to lower pt into chair; pt "in a daze" so donned 02 and Sp02 read 86% (assuming much lower prior to session). Session limited due to above. Discussed importance of wearing 02 at this time. RN notified. Tech in room. Pt reports wanting to go home ASAP. Will follow.   Follow Up Recommendations  Home health PT;Supervision for mobility/OOB     Equipment Recommendations  None recommended by PT    Recommendations for Other Services       Precautions / Restrictions Precautions Precautions: Fall Precaution Comments: watch sats closely Required Braces or Orthoses: Other Brace/Splint Other Brace/Splint: post op shoe left foot (does not like to wear it) Restrictions Weight Bearing Restrictions: No    Mobility  Bed Mobility Overal bed mobility: Needs Assistance Bed Mobility: Rolling;Sidelying to Sit;Sit to Supine Rolling: Supervision Sidelying to sit: Supervision   Sit to supine: Supervision   General bed mobility comments: No assist needed to get EOB.  Transfers Overall transfer level: Needs assistance Equipment used: Rolling walker (2 wheeled) Transfers: Sit to/from Stand Sit to Stand: Min guard         General transfer comment: Min guard for safety. Stood from [employer]OB x1, from chair x1.   Ambulation/Gait Ambulation/Gait assistance: Min assist Ambulation  Distance (Feet): 15 Feet Assistive device: Rolling walker (2 wheeled) Gait Pattern/deviations: Step-through pattern;Decreased stride length;Staggering right;Staggering left Gait velocity: decreased   General Gait Details: Pt with very slow, staggering gait x2 on RA. After asking many times, pt reports dizziness and almost LOB into chair with therapist lowering pt down. 02 donned. Sp02 86% on RA and prob much lower than that prior to walking.   Stairs            Wheelchair Mobility    Modified Rankin (Stroke Patients Only)       Balance Overall balance assessment: Needs assistance Sitting-balance support: Feet supported;No upper extremity supported Sitting balance-Leahy Scale: Good     Standing balance support: During functional activity Standing balance-Leahy Scale: Poor Standing balance comment: Requires UE support today.                            Cognition Arousal/Alertness: Awake/alert Behavior During Therapy: WFL for tasks assessed/performed Overall Cognitive Status: Within Functional Limits for tasks assessed                                 General Comments: Seems sort of "out of it" today. Like in a "daze"      Exercises      General Comments General comments (skin integrity, edema, etc.): Pt on RA upon PT arrival. Tried to get Sp02 reading but stated in 30% deemed not accurate, however once ambulating , pt staggering and reported dizziness- Sp02 86% after 02 donned.  Pertinent Vitals/Pain Pain Assessment: No/denies pain    Home Living                      Prior Function            PT Goals (current goals can now be found in the care plan section) Progress towards PT goals: Not progressing toward goals - comment (secondary to low 02 and dizziness.)    Frequency    Min 3X/week      PT Plan Current plan remains appropriate    Co-evaluation              AM-PAC PT "6 Clicks" Daily Activity   Outcome Measure  Difficulty turning over in bed (including adjusting bedclothes, sheets and blankets)?: None Difficulty moving from lying on back to sitting on the side of the bed? : None Difficulty sitting down on and standing up from a chair with arms (e.g., wheelchair, bedside commode, etc,.)?: None Help needed moving to and from a bed to chair (including a wheelchair)?: A Little Help needed walking in hospital room?: A Little Help needed climbing 3-5 steps with a railing? : A Lot 6 Click Score: 20    End of Session Equipment Utilized During Treatment: Gait belt;Oxygen Activity Tolerance: Treatment limited secondary to medical complications (Comment) (dizziness, low 02) Patient left: in bed;with call bell/phone within reach;with bed alarm set Nurse Communication: Mobility status PT Visit Diagnosis: Other abnormalities of gait and mobility (R26.89);Difficulty in walking, not elsewhere classified (R26.2)     Time: 1610-9604 PT Time Calculation (min) (ACUTE ONLY): 16 min  Charges:  $Gait Training: 8-22 mins                    G Codes:       Mylo Red, PT, DPT [phone]     Blake Divine A Markez Dowland 01/05/2017, 3:30 PM

## 2017-01-05 NOTE — Progress Notes (Signed)
PROGRESS NOTE    Cynthia Dillon  ZOX:[id number] DOB: 1966/01/16 DOA: 12/30/2016 PCP: System, Pcp Not In   Brief Narrative:  51 year old female with poorly controlled diabetes, coronary artery disease status post MI, transmetatarsal amputation came to [facility] last week with complaints of nonproductive cough, fevers and chills along with hypoxia. CAT scan showed pneumonia therefore started on Levaquin and amoxicillin. Clinical status continued to worsen therefore antibiotics were changed to vancomycin and Zosyn. Due to acute renal failure she was switched to imipenem. Initially [facility] she was noted to elevated BNP. C. difficile was checked which was negative. Unfortunately during her stay she had a cardiac arrest/PEA therefore CPR was performed and return of spontaneous circulation after 6 minutes. She was transferred to the ICU for closer monitoring.   Assessment & Plan:   Principal Problem:   Respiratory failure, acute (HCC) Active Problems:   DM (diabetes mellitus), type 2 with peripheral vascular complications (HCC)   Diabetic foot ulcer associated with diabetes mellitus due to underlying condition (HCC)   Multifocal pneumonia   Acute renal failure superimposed on stage 3 chronic kidney disease (HCC)   Cardiac arrest (HCC)  Acute hypoxic respiratory failure - Multifactorial, secondary to pneumonia, and volume overload. - Patient reports she has home oxygen, but did not require any oxygen  for the last 3-4 month - Appears to be improving with antibiotic on diuresis, she was able to tolerate 4 L nasal cannula ambulation yesterday , but she does have worsening hypoxia when she sees at night, so most likely she has undiagnosed OSA , she will need sleep study in outpatient setting . is on 4 L nasal cannula this am. -  chest x-ray this a.m. with a slight improvement of bibasilar opacities.  HCAP - Antibiotic management per ID. - Continue Zosyn and Linezolid, she will finish  antibiotics today, still with low-grade temperature 100.3  Acute diastolic CHF - Chest x-ray with evidence of small bilateral pleural effusion, and edema, 2D echo with a grade 2 diastolic CHF, continue with IV diuresis. - We'll increase IV Lasix back to 80 mg IV twice a day, giving no significant urine output yesterday .very likely will need to be discharged on oral diuresis.  Coronary artery disease - Status postcardiac arrest on 12/30/2016, reviewed her records from [place], she had another episode of cardiopulmonary arrest January of this year secondary to sepsis/acidosis secondary to her foot infection, no significant events on telemetry, no V. tach or malignant arrhythmias . - No malignant arrhythmias on telemetry, discussed with cardiology , these areas are related to other metabolic derangement from respiratory problems and metabolic problems, and unlikely related to heart disease especially in the setting of preserved EF , will start on low dose beta blockers, and replete electrolytes as needed . - Echocardiogram does not show any wall motion abnormality.  CKG stage IV -Avoid nephrotoxic drugs. Closely monitor renal function -Will need outpatient follow-up with nephrology -Monitor urine output  Uncontrolled diabetes type 2 complicated by diabetic foot ulcer and neuropathy -A1c 9.8 on 12/30/2016 -Continue sliding scale, CVG are uncontrolled, will increase Lantus from 6-10 units.  Anemia of chronic disease -  No need for transfusion at this time. No signs of active bleeding  Diabetes mellitus - CBG remains uncontrolled on insulin sliding scale, will start on low-dose Lantus  Left foot wound - Wound Care input appreciated  DVT prophylaxis: Subcutaneous heparin Code Status: Full Family Communication:  None at bedside Disposition Plan: Transfer to telemetry floor today, hopefully  she can be discharged tomorrow  Consultants:   Pulmonary  Infectious  disease   Subjective: Denies any complaints, reports she is feeling much better, denies any cough, denies any dyspnea, MAXIMUM TEMPERATURE 100.3 . Objective: Vitals:   01/05/17 0600 01/05/17 0755 01/05/17 0756 01/05/17 0800  BP:  (!) 114/46  126/69  Pulse: 94 89  88  Resp: (!) 26 (!) 24  (!) 25  Temp:  100.2 F (37.9 C)    TempSrc:  Axillary    SpO2: 92% 93% 93% 98%  Weight:      Height:        Intake/Output Summary (Last 24 hours) at 01/05/17 1100 Last data filed at 01/05/17 0300  Gross per 24 hour  Intake              240 ml  Output              350 ml  Net             -110 ml   Filed Weights   01/03/17 0400 01/04/17 1700 01/05/17 0500  Weight: 82.6 kg (182 lb) 81.5 kg (179 lb 10.8 oz) 81 kg (178 lb 9.2 oz)    Examination:  Awake alert oriented 3, in no apparent distress  Good air entry bilaterally, no wheezing, bibasilar crackles is improving  RRR, S1 and S2 Heard, no Rubs,  murmur or gallops. Abdomen soft, nontender nondistended bowel sounds present Left foot with transmetatarsal amputation, with 2 small ulcers, with no oozing or draining    Data Reviewed:   CBC:  Recent Labs Lab 12/30/16 1254  01/01/17 0325 01/01/17 1552 01/02/17 0230 01/03/17 0512 01/05/17 0218  WBC 8.6  < > 7.5 8.4 7.9 7.4 7.7  NEUTROABS 5.6  --   --   --   --  5.4  --   HGB 7.1*  < > 6.1* 7.6* 7.5* 8.2* 8.1*  HCT 23.1*  < > 19.2* 23.7* 23.9* 26.1* 25.9*  MCV 83.7  < > 81.4 82.6 83.0 83.1 84.1  PLT 362  < > 290 296 310 307 328  < > = values in this interval not displayed. Basic Metabolic Panel:  Recent Labs Lab 12/30/16 1254 12/30/16 2000  12/31/16 [id number] 01/01/17 0325 01/02/17 0230 01/03/17 0512 01/04/17 1008 01/05/17 0218  NA 130* 132*  < >  --  132* 133* 130* 132* 130*  K 4.7 4.8  < >  --  3.9 3.8 3.5 4.0 4.6  CL 103 103  < >  --  98* 97* 93* 98* 97*  CO2 17* 19*  < >  --  22 25 24 22 23   GLUCOSE 163* 121*  < >  --  225* 120* 121* 199* 134*  BUN 29* 29*  < >  --   31* 31* 34* 35* 36*  CREATININE 3.51* 3.48*  < >  --  3.69* 3.37* 3.33* 3.58* 3.49*  CALCIUM 7.7* 7.8*  < >  --  7.3* 7.4* 7.5* 7.7* 7.8*  MG 1.7 1.7  --  1.9  --   --  1.6* 1.7  --   PHOS  --  3.9  --  4.8*  --   --  3.9 4.0  --   < > = values in this interval not displayed. GFR: Estimated Creatinine Clearance: 19.8 mL/min (A) (by C-G formula based on SCr of 3.49 mg/dL (H)). Liver Function Tests:  Recent Labs Lab 12/30/16 1254 01/02/17 0230  AST 23 18  ALT  19 19  ALKPHOS 154* 122  BILITOT 0.6 0.5  PROT 6.7 5.9*  ALBUMIN 2.2* 1.7*   No results for input(s): LIPASE, AMYLASE in the last 168 hours. No results for input(s): AMMONIA in the last 168 hours. Coagulation Profile:  Recent Labs Lab 12/30/16 1254  INR 1.17   Cardiac Enzymes:  Recent Labs Lab 12/30/16 1903 12/31/16 0037 12/31/16 0635  TROPONINI 0.04* 0.05* 0.06*   BNP (last 3 results) No results for input(s): PROBNP in the last 8760 hours. HbA1C: No results for input(s): HGBA1C in the last 72 hours. CBG:  Recent Labs Lab 01/03/17 1618 01/03/17 2010 01/03/17 2331 01/04/17 2124 01/05/17 0054  GLUCAP 210* 242* 222* 278* 144*   Lipid Profile: No results for input(s): CHOL, HDL, LDLCALC, TRIG, CHOLHDL, LDLDIRECT in the last 72 hours. Thyroid Function Tests: No results for input(s): TSH, T4TOTAL, FREET4, T3FREE, THYROIDAB in the last 72 hours. Anemia Panel: No results for input(s): VITAMINB12, FOLATE, FERRITIN, TIBC, IRON, RETICCTPCT in the last 72 hours. Sepsis Labs:  Recent Labs Lab 12/30/16 1254 12/30/16 1302 12/31/16 0037 01/01/17 0325 01/03/17 0512  PROCALCITON 0.79  --  1.01 14.13 20.99  LATICACIDVEN  --  0.8 1.0  --   --     Recent Results (from the past 240 hour(s))  Culture, blood (routine x 2) Call MD if unable to obtain prior to antibiotics being given     Status: None   Collection Time: 12/30/16 12:54 PM  Result Value Ref Range Status   Specimen Description BLOOD RIGHT ARM  Final    Special Requests IN PEDIATRIC BOTTLE Blood Culture adequate volume  Final   Culture NO GROWTH 5 DAYS  Final   Report Status 01/04/2017 FINAL  Final  Culture, blood (routine x 2) Call MD if unable to obtain prior to antibiotics being given     Status: None   Collection Time: 12/30/16 12:54 PM  Result Value Ref Range Status   Specimen Description BLOOD RIGHT HAND  Final   Special Requests IN PEDIATRIC BOTTLE Blood Culture adequate volume  Final   Culture NO GROWTH 5 DAYS  Final   Report Status 01/04/2017 FINAL  Final  Urine culture     Status: None   Collection Time: 12/30/16  4:36 PM  Result Value Ref Range Status   Specimen Description URINE, RANDOM  Final   Special Requests NONE  Final   Culture NO GROWTH  Final   Report Status 12/31/2016 FINAL  Final  Urine culture     Status: None   Collection Time: 12/30/16  8:42 PM  Result Value Ref Range Status   Specimen Description URINE, CLEAN CATCH  Final   Special Requests Immunocompromised  Final   Culture NO GROWTH  Final   Report Status 01/01/2017 FINAL  Final  MRSA PCR Screening     Status: None   Collection Time: 12/30/16  8:44 PM  Result Value Ref Range Status   MRSA by PCR NEGATIVE NEGATIVE Final    Comment:        The GeneXpert MRSA Assay (FDA approved for NASAL specimens only), is one component of a comprehensive MRSA colonization surveillance program. It is not intended to diagnose MRSA infection nor to guide or monitor treatment for MRSA infections.   Respiratory Panel by PCR     Status: None   Collection Time: 12/31/16  4:50 PM  Result Value Ref Range Status   Adenovirus NOT DETECTED NOT DETECTED Final   Coronavirus 229E NOT DETECTED NOT  DETECTED Final   Coronavirus HKU1 NOT DETECTED NOT DETECTED Final   Coronavirus NL63 NOT DETECTED NOT DETECTED Final   Coronavirus OC43 NOT DETECTED NOT DETECTED Final   Metapneumovirus NOT DETECTED NOT DETECTED Final   Rhinovirus / Enterovirus NOT DETECTED NOT DETECTED Final    Influenza A NOT DETECTED NOT DETECTED Final   Influenza B NOT DETECTED NOT DETECTED Final   Parainfluenza Virus 1 NOT DETECTED NOT DETECTED Final   Parainfluenza Virus 2 NOT DETECTED NOT DETECTED Final   Parainfluenza Virus 3 NOT DETECTED NOT DETECTED Final   Parainfluenza Virus 4 NOT DETECTED NOT DETECTED Final   Respiratory Syncytial Virus NOT DETECTED NOT DETECTED Final   Bordetella pertussis NOT DETECTED NOT DETECTED Final   Chlamydophila pneumoniae NOT DETECTED NOT DETECTED Final   Mycoplasma pneumoniae NOT DETECTED NOT DETECTED Final         Radiology Studies: Dg Chest Port 1 View  Result Date: 01/05/2017 CLINICAL DATA:  51 year old female with a history of respiratory failure EXAM: PORTABLE CHEST 1 VIEW COMPARISON:  01/03/2017, 01/02/2017 FINDINGS: Cardiomediastinal silhouette unchanged. Low lung volumes with opacities at the bilateral lung bases, slightly improved from the prior. Mild interlobular septal thickening bilaterally. Fullness in the central vascular. No pneumothorax. Obscuration of the left and right hemidiaphragms as well as the left heart border. No displaced fracture IMPRESSION: Slight improvement of bibasilar opacities, likely combination of edema, pleural effusion, atelectasis/consolidation. Electronically Signed   By: Gilmer Mor D.O.   On: 01/05/2017 07:41        Scheduled Meds: . collagenase   Topical Daily  . feeding supplement (PRO-STAT SUGAR FREE 64)  30 mL Oral BID  . furosemide  80 mg Intravenous Q12H  . gabapentin  600 mg Oral BID  . heparin  5,000 Units Subcutaneous Q8H  . insulin aspart  0-15 Units Subcutaneous Q4H  . insulin glargine  6 Units Subcutaneous Daily  . ipratropium-albuterol  3 mL Nebulization TID  . levothyroxine  25 mcg Oral QAC breakfast  . mouth rinse  15 mL Mouth Rinse BID  . multivitamin with minerals  1 tablet Oral Daily  . sodium chloride flush  3 mL Intravenous Q12H   Continuous Infusions: . linezolid (ZYVOX) IV  Stopped (01/05/17 0915)  . piperacillin-tazobactam (ZOSYN)  IV Stopped (01/05/17 0926)     LOS: 6 days     Huey Bienenstock, MD [facility] Pager [phone]  If 7PM-7AM, please contact night-coverage [email or web address] Password [email or web address] 01/05/2017, 11:00 AM

## 2017-01-06 ENCOUNTER — Inpatient Hospital Stay (HOSPITAL_COMMUNITY): Payer: Medicaid Other

## 2017-01-06 LAB — BASIC METABOLIC PANEL
Anion gap: 11 (ref 5–15)
BUN: 44 mg/dL — AB (ref 6–20)
CHLORIDE: 96 mmol/L — AB (ref 101–111)
CO2: 25 mmol/L (ref 22–32)
Calcium: 7.7 mg/dL — ABNORMAL LOW (ref 8.9–10.3)
Creatinine, Ser: 3.85 mg/dL — ABNORMAL HIGH (ref 0.44–1.00)
GFR calc Af Amer: 15 mL/min — ABNORMAL LOW (ref >60)
GFR calc non Af Amer: 13 mL/min — ABNORMAL LOW (ref >60)
GLUCOSE: 256 mg/dL — AB (ref 65–99)
Potassium: 4.6 mmol/L (ref 3.5–5.1)
Sodium: 132 mmol/L — ABNORMAL LOW (ref 135–145)

## 2017-01-06 LAB — GLUCOSE, CAPILLARY
GLUCOSE-CAPILLARY: 274 mg/dL — AB (ref 65–99)
GLUCOSE-CAPILLARY: 167 mg/dL — AB (ref 65–99)
Glucose-Capillary: 223 mg/dL — ABNORMAL HIGH (ref 65–99)
Glucose-Capillary: 278 mg/dL — ABNORMAL HIGH (ref 65–99)
Glucose-Capillary: 285 mg/dL — ABNORMAL HIGH (ref 65–99)
Glucose-Capillary: 213 mg/dL — ABNORMAL HIGH (ref 65–99)

## 2017-01-06 MED ORDER — GABAPENTIN 300 MG PO CAPS: 600.0000 mg | ORAL_CAPSULE | Freq: Every day | ORAL | 0 refills | Status: AC

## 2017-01-06 MED ORDER — METOPROLOL TARTRATE 25 MG PO TABS: 12.5000 mg | ORAL_TABLET | Freq: Two times a day (BID) | ORAL | 0 refills | Status: AC

## 2017-01-06 MED ORDER — INSULIN GLARGINE 100 UNIT/ML ~~LOC~~ SOLN
16.0000 [IU] | Freq: Every day | SUBCUTANEOUS | Status: DC
  Filled 2017-01-06: qty 0.16

## 2017-01-06 MED ORDER — INSULIN GLARGINE 100 UNIT/ML ~~LOC~~ SOLN
6.0000 [IU] | Freq: Once | SUBCUTANEOUS | Status: AC
  Administered 2017-01-06: 6 [IU] via SUBCUTANEOUS
  Filled 2017-01-06: qty 0.06

## 2017-01-06 MED ORDER — COLLAGENASE 250 UNIT/GM EX OINT: TOPICAL_OINTMENT | Freq: Every day | CUTANEOUS | 0 refills | Status: AC

## 2017-01-06 NOTE — Discharge Summary (Deleted)
Cynthia Dillon, is a 51 y.o. female  DOB 02/08/1966  MRN [id number].  Admission date:  12/30/2016  Admitting Physician  Lahoma Crocker, MD  Discharge Date:  01/06/2017   Primary MD  System, Pcp Not In  Recommendations for primary care physician for things to follow:  - Please check CBC, BMP during next visit, to ensure stable renal function, creatinine 3.8 on discharge. No Lasix has been given on discharge, may consider low dose Lasix if renal function continues to improve and she develops volume overload. I have scheduled an appointment for patient with her PCP this am and Friday at 11 AM. - Patient will need sleep study as an outpatient, as she is most likely having obstructive sleep apnea. - Patient will need to reschedule her renal follow-up appointment as she missed previous one given she was hospitalized.  Admission Diagnosis  pneumonia   Discharge Diagnosis  pneumonia   Principal Problem:   Respiratory failure, acute (HCC) Active Problems:   DM (diabetes mellitus), type 2 with peripheral vascular complications (HCC)   Diabetic foot ulcer associated with diabetes mellitus due to underlying condition (HCC)   Multifocal pneumonia   Acute renal failure superimposed on stage 3 chronic kidney disease (HCC)   Cardiac arrest [facility])      Past Medical History:  Diagnosis Date  . Anemia   . Chronic kidney disease    "problems peeing" (12/30/2016)  . Depression   . Diabetes (HCC)    "dx'd age 19 when I had my 1st baby; always took shots when I was pregnant; didn't take them between pregnancies; started taking shots again ~ 3 yr ago"  . Heart murmur   . History of blood transfusion    "blood count was low" (12/30/2016)  . Hypertension   . Migraine    "~ q 3 days" (12/30/2016)  . Myocardial infarction [facility]) ~ 10/2016   "[facility]"  . On home oxygen therapy    "2-3L prn; aien't  wore it in ~ 3 months" (12/30/2016)  . Pneumonia    "now & 1-2 times before" (12/30/2016)  . Short-term memory loss    "short term memory loss for the last year" (12/30/2016)    Past Surgical History:  Procedure Laterality Date  . CHOLECYSTECTOMY OPEN    . FOOT AMPUTATION THROUGH METATARSAL Left 02/15/2014       History of present illness and  Hospital Course:     Kindly see H&P for history of present illness and admission details, please review complete Labs, Consult reports and Test reports for all details in brief  HPI  from the history and physical done on the day of admission 12/30/2016 HPI: Cynthia Dillon is a 51 y.o. 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 She presented to [facility] with complaints of nausea and vomiting and was found to have increased oxygen requirement. She felt  mildly short of breath but does use nocturnal home oxygen for an unknown reason. CT scan showed pneumonia and she was admitted into the hospital initially using Levaquin and oxacillin. Unfortunately her clinical status worsened she required more oxygen and started having fevers. Antibiotics were changed to vancomycin and Zosyn. She did continue to have fevers throughout the next 24 hours and could not be taken off of her 100% nonrebreather. Her creatinine worsened and she developed acute on chronic renal failure with a creatinine as high as 3.02. She was switched to imipenem today and given her worsening clinical status she was accepted in transfer here to [facility] for infectious diseases and pulmonary with possible renal evaluation. Cultures at [facility] are pending   Hospital Course  51 year old female with poorly controlled diabetes, coronary artery disease status post MI, transmetatarsal amputation came to [facility]  last week with complaints of nonproductive cough, fevers and chills along with hypoxia. CAT scan showed pneumonia therefore started on Levaquin and amoxicillin. Clinical status continued to worsen therefore antibiotics were changed to vancomycin and Zosyn. Due to acute renal failure she was switched to imipenem. Initially [facility] she was noted to elevated BNP. C. difficile was checked which was negative. Unfortunately during her stay she had a cardiac arrest/PEA therefore CPR was performed and return of spontaneous circulation after 6 minutes. She was transferred to the ICU for closer monitoring. She continues to improve, back to 4 L nasal cannula.  Acute hypoxic respiratory failure - Multifactorial, secondary to pneumonia, and volume overload. - Patient reports she has home oxygen, but did not require any oxygen  for the last 3-4 month -  improving with antibiotic on diuresis, currently she is on 4 L nasal cannula, e, but she does have worsening hypoxia when she sees at night, so most likely she has undiagnosed OSA , she will need sleep study in outpatient setting . -  Was recent chest x-ray with a slight improvement of bibasilar opacities.  HCAP - Antibiotic management per ID. - Finish total of 7 days of IV Zosyn and linezolid during hospital stay, no further need for antibiotics on discharge.  Acute diastolic CHF - Chest x-ray with evidence of small bilateral pleural effusion, and edema, 2D echo with a grade 2 diastolic CHF,  treated with IV diuresis during hospital stay, currently no significant evidence of volume overload, will not be discharging on any Lasix as an outpatient giving him in her creatinine, it is 3.8 today, Has been averaging 3.5 during hospital stay  Coronary artery disease - Status postcardiac arrest on 12/30/2016, reviewed her records from [facility], she had another episode of cardiopulmonary arrest January of this year secondary to sepsis/acidosis secondary to her foot  infection, no significant events on telemetry, no V. tach or malignant arrhythmias . - No malignant arrhythmias on telemetry, discussed with cardiology , these areas are related to other metabolic derangement from respiratory problems and metabolic problems, and unlikely related to heart disease especially in the setting of preserved EF , will start on low dose beta blockers,  Will discharge metoprolol 12.5 mg twice a day - Echocardiogram does not show any wall motion abnormality.  CKG stage IV -Avoid nephrotoxic drugs. Closely monitor renal function, routine baseline creatinine during hospital stay is 3.5, has bumped to 3.8 at day of discharge, but this is secondary to overdiuresis, no volume overload at time of discharge, so no diuresis will be given, she was instructed to follow with her PCP next week for repeat labs,  and to schedule her renal appointment as she missed it last week  when she was hospitalized .  Uncontrolled diabetes type 2 complicated by diabetic foot ulcer and neuropathy - A1c 9.8 on 12/30/2016, to continue home regimen on discharge   Anemia of chronic disease -  No need for transfusion at this time. No signs of active bleeding  Left foot wound - Wound Care input appreciated   Discharge Condition:  stable   Follow UP  Follow-up Information    [facility] Follow up.   Why:  [place] scheduled for this coming Friday, June 1st at 11 AM Contact information: [address] [place], [place] [address]       Hours: Open  Closes 1PM                    [address] [place], [place] [address]  [phone]              Discharge Instructions  and  Discharge Medications     Discharge Instructions    Diet - low sodium heart healthy    Complete by:  As directed    Discharge instructions    Complete by:  As directed    Follow with Primary MD ([facility]) in 7 days   Get CBC, CMP, 2 view Chest X ray checked   by Primary MD next visit.    Activity: As tolerated with Full fall precautions use walker/cane & assistance as needed   Disposition Home    Diet: Heart Healthy ,carb modified with 1.5 L/day fluid restriction , with feeding assistance and aspiration precautions.  For Heart failure patients - Check your Weight same time everyday, if you gain over 2 pounds, or you develop in leg swelling, experience more shortness of breath or chest pain, call your Primary MD immediately. Follow Cardiac Low Salt Diet and 1.5 lit/day fluid restriction.   On your next visit with your primary care physician please Get Medicines reviewed and adjusted.   Please request your Prim.MD to go over all Hospital Tests and Procedure/Radiological results at the follow up, please get all Hospital records sent to your Prim MD by signing hospital release before you go home.   If you experience worsening of your admission symptoms, develop shortness of breath, life threatening emergency, suicidal or homicidal thoughts you must seek medical attention immediately by calling 911 or calling your MD immediately  if symptoms less severe.  You Must read complete instructions/literature along with all the possible adverse reactions/side effects for all the Medicines you take and that have been prescribed to you. Take any new Medicines after you have completely understood and accpet all the possible adverse reactions/side effects.   Do not drive, operating heavy machinery, perform activities at heights, swimming or participation in water activities or provide baby sitting services if your were admitted for syncope or siezures until you have seen by Primary MD or a Neurologist and advised to do so again.  Do not drive when taking Pain medications.    Do not take more than prescribed Pain, Sleep and Anxiety Medications  Special Instructions: If you have smoked or chewed Tobacco  in the last 2 yrs please stop smoking, stop any regular  Alcohol  and or any Recreational drug use.  Wear Seat belts while driving.   Please note  You were cared for by a hospitalist during your hospital stay. If you have any questions  about your discharge medications or the care you received while you were in the hospital after you are discharged, you can call the unit and asked to speak with the hospitalist on call if the hospitalist that took care of you is not available. Once you are discharged, your primary care physician will handle any further medical issues. Please note that NO REFILLS for any discharge medications will be authorized once you are discharged, as it is imperative that you return to your primary care physician (or establish a relationship with a primary care physician if you do not have one) for your aftercare needs so that they can reassess your need for medications and monitor your lab values.   Increase activity slowly    Complete by:  As directed    Increase activity slowly    Complete by:  As directed      Allergies as of 01/06/2017      Reactions   Latex Rash      Medication List    STOP taking these medications   amLODipine 10 MG tablet Commonly known as:  NORVASC   NEURONTIN 600 MG tablet Generic drug:  gabapentin Replaced by:  gabapentin 300 MG capsule   traMADol 50 MG tablet Commonly known as:  ULTRAM     TAKE these medications   aspirin EC 81 MG tablet Take 81 mg by mouth daily.   collagenase ointment Commonly known as:  SANTYL Apply topically daily.   gabapentin 300 MG capsule Commonly known as:  NEURONTIN Take 2 capsules (600 mg total) by mouth daily. Start taking on:  01/07/2017 Replaces:  NEURONTIN 600 MG tablet   insulin NPH-regular Human (70-30) 100 UNIT/ML injection Commonly known as:  NOVOLIN 70/30 Inject 7-40 Units into the skin 2 (two) times daily with a meal. Per sliding scale   metoprolol tartrate 25 MG tablet Commonly known as:  LOPRESSOR Take 0.5 tablets (12.5 mg total) by  mouth 2 (two) times daily.   sertraline 50 MG tablet Commonly known as:  ZOLOFT Take 50 mg by mouth daily.         Diet and Activity recommendation: See Discharge Instructions above   Consults obtained -    Pulmonary  Infectious disease   Major procedures and Radiology Reports - PLEASE review detailed and final reports for all details, in brief -     Ct Chest Wo Contrast  Result Date: 12/30/2016 CLINICAL DATA:  Hypoxemia EXAM: CT CHEST WITHOUT CONTRAST TECHNIQUE: Multidetector CT imaging of the chest was performed following the standard protocol without IV contrast. COMPARISON:  Radiograph 12/30/2016 FINDINGS: Cardiovascular: Limited without intravenous contrast. Non aneurysmal aorta. Scattered atherosclerotic calcification. Heart size upper normal. No large pericardial effusion. Mediastinum/Nodes: Mild mediastinal adenopathy. For example a lymph node adjacent to the aortic arch measures 9 mm. Midline trachea. A right paratracheal lymph node measures 1 cm. No thyroid mass. Esophagus within normal limits. Lungs/Pleura: Small bilateral pleural effusions. Extensive consolidation within the bilateral lower lobes with scattered ground-glass densities and partial consolidations in the bilateral upper lobes, relative sparing of the right middle lobe. There are multiple subcentimeter pulmonary nodules, right greater than left. Suspect that these are inflammatory or infectious. No pneumothorax Upper Abdomen: No acute abnormality Musculoskeletal: No acute or suspicious bone lesion. Old left fifth and sixth rib fractures. IMPRESSION: 1. Small bilateral pleural effusions with extensive bilateral lower lobe consolidations with additional ground-glass density and multifocal consolidations in the upper lobes, concerning for multifocal pneumonia. 2. There are multiple subcentimeter bilateral pulmonary  nodules, right greater than left, suspect that these are inflammatory or infectious. Non-contrast chest  CT at 3-6 months is recommended. If the nodules are stable at time of repeat CT, then future CT at 18-24 months (from today's scan) is considered optional for low-risk patients, but is recommended for high-risk patients. This recommendation follows the consensus statement: Guidelines for Management of Incidental Pulmonary Nodules Detected on CT Images: From the Fleischner Society 2017; Radiology 2017; 284:228-243. 3. There is mild mediastinal adenopathy, which may be reactive. Electronically Signed   By: Jasmine Pang M.D.   On: 12/30/2016 18:44   Dg Chest Port 1 View  Result Date: 01/05/2017 CLINICAL DATA:  51 year old female with a history of respiratory failure EXAM: PORTABLE CHEST 1 VIEW COMPARISON:  01/03/2017, 01/02/2017 FINDINGS: Cardiomediastinal silhouette unchanged. Low lung volumes with opacities at the bilateral lung bases, slightly improved from the prior. Mild interlobular septal thickening bilaterally. Fullness in the central vascular. No pneumothorax. Obscuration of the left and right hemidiaphragms as well as the left heart border. No displaced fracture IMPRESSION: Slight improvement of bibasilar opacities, likely combination of edema, pleural effusion, atelectasis/consolidation. Electronically Signed   By: Gilmer Mor D.O.   On: 01/05/2017 07:41   Dg Chest Port 1 View  Result Date: 01/03/2017 CLINICAL DATA:  Pneumonia. EXAM: PORTABLE CHEST 1 VIEW COMPARISON:  01/02/2017 .  CT 12/30/2016 FINDINGS: Mediastinum hilar structures normal. Heart size is stable. Diffuse bilateral pulmonary infiltrates and/or edema again noted. Small bilateral pleural effusions again noted. Chest is unchanged from prior exam . IMPRESSION: Diffuse bilateral pulmonary infiltrates and/or edema again noted. Small bilateral pleural effusions again noted. No significant interim change from prior exams. Electronically Signed   By: Maisie Fus  Register   On: 01/03/2017 07:35   Dg Chest Port 1 View  Result Date:  01/02/2017 CLINICAL DATA:  51 year old female with history of pneumonia. Shortness of breath. EXAM: PORTABLE CHEST 1 VIEW COMPARISON:  Chest x-ray 01/01/2017. FINDINGS: Lung volumes are normal. Extensive multifocal interstitial and airspace disease most confluent throughout the mid to lower lungs bilaterally. Moderate bilateral pleural effusions. Cephalization of the pulmonary vasculature. Mild cardiomegaly. The patient is rotated to the left on today's exam, resulting in distortion of the mediastinal contours and reduced diagnostic sensitivity and specificity for mediastinal pathology. Atherosclerosis in the thoracic aorta. IMPRESSION: 1. The appearance the chest is very similar to prior studies. Based on comparison with recent CT examination, this presumably reflects a combination of severe multilobar pneumonia (most evident throughout the mid to lower lungs), superimposed upon a background of mild interstitial pulmonary edema from congestive heart failure, as above. 2. Aortic atherosclerosis. Electronically Signed   By: Trudie Reed M.D.   On: 01/02/2017 07:14   Dg Chest Port 1 View  Result Date: 01/01/2017 CLINICAL DATA:  Diabetes, HTN, hx of MI, PNA EXAM: PORTABLE CHEST 1 VIEW COMPARISON:  12/30/2016 FINDINGS: There is evidence of mild worsening of lung aeration most evident in the lung bases, some of which may be due to differences in patient positioning. Bilateral pleural effusions appear larger. No pneumothorax. No other change. IMPRESSION: 1. Mild worsening in lung aeration consistent with worsened interstitial airspace pulmonary edema and an increase in pleural effusions. Electronically Signed   By: Amie Portland M.D.   On: 01/01/2017 07:31   Dg Chest Port 1 View  Result Date: 12/30/2016 CLINICAL DATA:  Post cardiac arrest EXAM: PORTABLE CHEST 1 VIEW COMPARISON:  12/30/2016 FINDINGS: Borderline cardiomegaly. Small bilateral effusions. Central vascular congestion with worsened bilateral  pulmonary edema. Bibasilar consolidations. No pneumothorax. IMPRESSION: 1. Borderline cardiomegaly with worsened central vascular congestion and pulmonary edema. 2. There are bilateral pleural effusions and bibasilar infiltrates. Electronically Signed   By: Jasmine Pang M.D.   On: 12/30/2016 20:35   Dg Chest Port 1 View  Result Date: 12/30/2016 CLINICAL DATA:  Respiratory failure for 4 days. EXAM: PORTABLE CHEST 1 VIEW COMPARISON:  None. FINDINGS: The cardiomediastinal silhouette is unremarkable. Pulmonary vascular congestion and possible interstitial edema noted. Bilateral opacities within the mid and lower lungs bilaterally noted with trace bilateral pleural effusions. There is no evidence of pneumothorax. No acute bony abnormalities are identified. IMPRESSION: Bilateral lower lung opacities which may represent airspace disease/edema and/or atelectasis. Trace bilateral pleural effusions. Pulmonary vascular congestion and possible interstitial pulmonary edema. Electronically Signed   By: Harmon Pier M.D.   On: 12/30/2016 13:29    Micro Results    Recent Results (from the past 240 hour(s))  Culture, blood (routine x 2) Call MD if unable to obtain prior to antibiotics being given     Status: None   Collection Time: 12/30/16 12:54 PM  Result Value Ref Range Status   Specimen Description BLOOD RIGHT ARM  Final   Special Requests IN PEDIATRIC BOTTLE Blood Culture adequate volume  Final   Culture NO GROWTH 5 DAYS  Final   Report Status 01/04/2017 FINAL  Final  Culture, blood (routine x 2) Call MD if unable to obtain prior to antibiotics being given     Status: None   Collection Time: 12/30/16 12:54 PM  Result Value Ref Range Status   Specimen Description BLOOD RIGHT HAND  Final   Special Requests IN PEDIATRIC BOTTLE Blood Culture adequate volume  Final   Culture NO GROWTH 5 DAYS  Final   Report Status 01/04/2017 FINAL  Final  Urine culture     Status: None   Collection Time: 12/30/16  4:36 PM   Result Value Ref Range Status   Specimen Description URINE, RANDOM  Final   Special Requests NONE  Final   Culture NO GROWTH  Final   Report Status 12/31/2016 FINAL  Final  Urine culture     Status: None   Collection Time: 12/30/16  8:42 PM  Result Value Ref Range Status   Specimen Description URINE, CLEAN CATCH  Final   Special Requests Immunocompromised  Final   Culture NO GROWTH  Final   Report Status 01/01/2017 FINAL  Final  MRSA PCR Screening     Status: None   Collection Time: 12/30/16  8:44 PM  Result Value Ref Range Status   MRSA by PCR NEGATIVE NEGATIVE Final    Comment:        The GeneXpert MRSA Assay (FDA approved for NASAL specimens only), is one component of a comprehensive MRSA colonization surveillance program. It is not intended to diagnose MRSA infection nor to guide or monitor treatment for MRSA infections.   Respiratory Panel by PCR     Status: None   Collection Time: 12/31/16  4:50 PM  Result Value Ref Range Status   Adenovirus NOT DETECTED NOT DETECTED Final   Coronavirus 229E NOT DETECTED NOT DETECTED Final   Coronavirus HKU1 NOT DETECTED NOT DETECTED Final   Coronavirus NL63 NOT DETECTED NOT DETECTED Final   Coronavirus OC43 NOT DETECTED NOT DETECTED Final   Metapneumovirus NOT DETECTED NOT DETECTED Final   Rhinovirus / Enterovirus NOT DETECTED NOT DETECTED Final   Influenza A NOT DETECTED NOT DETECTED Final   Influenza  B NOT DETECTED NOT DETECTED Final   Parainfluenza Virus 1 NOT DETECTED NOT DETECTED Final   Parainfluenza Virus 2 NOT DETECTED NOT DETECTED Final   Parainfluenza Virus 3 NOT DETECTED NOT DETECTED Final   Parainfluenza Virus 4 NOT DETECTED NOT DETECTED Final   Respiratory Syncytial Virus NOT DETECTED NOT DETECTED Final   Bordetella pertussis NOT DETECTED NOT DETECTED Final   Chlamydophila pneumoniae NOT DETECTED NOT DETECTED Final   Mycoplasma pneumoniae NOT DETECTED NOT DETECTED Final       Today   Subjective:   Cynthia  Dillon today has no headache,no chest abdominal pain,no new weakness tingling or numbness, feels much better wants to go home today.   Objective:   Blood pressure (!) 130/55, pulse 100, temperature 98.6 F (37 C), resp. rate 18, height 5\' 4"  (1.626 m), weight 81 kg (178 lb 9.2 oz), last menstrual period 12/07/2016, SpO2 92 %.   Intake/Output Summary (Last 24 hours) at 01/06/17 1236 Last data filed at 01/06/17 0902  Gross per 24 hour  Intake              930 ml  Output             1400 ml  Net             -470 ml    Exam Awake Alert, Oriented x 3, No new F.N deficits, Normal affect Supple Neck,No JVD, No cervical lymphadenopathy appriciated.  Symmetrical Chest wall movement, Good air movement bilaterally, CTAB, no crackles  RRR,No Gallops,Rubs or new Murmurs, No Parasternal Heave +ve B.Sounds, Abd Soft, Non tender, No rebound -guarding or rigidity. No Cyanosis, Clubbing or edema, left foot with transmetatarsal amputation, with 2 small healing  ulcers,   Data Review   CBC w Diff:  Lab Results  Component Value Date   WBC 7.7 01/05/2017   HGB 8.1 (L) 01/05/2017   HCT 25.9 (L) 01/05/2017   PLT 328 01/05/2017   LYMPHOPCT 19 01/03/2017   MONOPCT 6 01/03/2017   EOSPCT 1 01/03/2017   BASOPCT 1 01/03/2017    CMP:  Lab Results  Component Value Date   NA 132 (L) 01/06/2017   K 4.6 01/06/2017   CL 96 (L) 01/06/2017   CO2 25 01/06/2017   BUN 44 (H) 01/06/2017   CREATININE 3.85 (H) 01/06/2017   PROT 5.9 (L) 01/02/2017   ALBUMIN 1.7 (L) 01/02/2017   BILITOT 0.5 01/02/2017   ALKPHOS 122 01/02/2017   AST 18 01/02/2017   ALT 19 01/02/2017  .   Total Time in preparing paper work, data evaluation and todays exam - 35 minutes  Lynnsey Barbara M.D on 01/06/2017 at 12:36 PM  [facility]   Office  [phone]

## 2017-01-06 NOTE — Progress Notes (Addendum)
PROGRESS NOTE    Cynthia Dillon  NUU:[id number] DOB: [birth year] DOA: 12/30/2016 PCP: System, Pcp Not In   Brief Narrative:  51 year old female with poorly controlled diabetes, coronary artery disease status post MI, transmetatarsal amputation came to [facility] last week with complaints of nonproductive cough, fevers and chills along with hypoxia. CAT scan showed pneumonia therefore started on Levaquin and amoxicillin. Clinical status continued to worsen therefore antibiotics were changed to vancomycin and Zosyn. Due to acute renal failure she was switched to imipenem. Initially [facility] she was noted to elevated BNP. C. difficile was checked which was negative. Unfortunately during her stay she had a cardiac arrest/PEA therefore CPR was performed and return of spontaneous circulation after 6 minutes. She was transferred to the ICU for closer monitoring.   Assessment & Plan:   Principal Problem:   Respiratory failure, acute (HCC) Active Problems:   DM (diabetes mellitus), type 2 with peripheral vascular complications (HCC)   Diabetic foot ulcer associated with diabetes mellitus due to underlying condition (HCC)   Multifocal pneumonia   Acute renal failure superimposed on stage 3 chronic kidney disease (HCC)   Cardiac arrest (HCC)  Acute hypoxic respiratory failure - Multifactorial, secondary to pneumonia, and volume overload. - Patient reports she has home oxygen, but did not require any oxygen  for the last 3-4 month - Appears to be improving with antibiotic on diuresis, she finished her antibiotic today, diuresis on hold given worsening renal function, she was hypoxic as afternoon when she went to the bathroom off oxygen, where she had presyncope. - The back on 5 L nasal cannula, will repeat chest x-ray, requested PCCM to reevaluate the patient regarding she still requiring 4-5 L nasal cannula.  Presyncope - Patient had a fainting episode in the bathroom, she went by herself  with no oxygen, was found to have oxygen saturation in the 70s, reviewed her telemetry during that episode, she maintained sinus rhythm, with some sinus tachycardia otherwise no malignant arrhythmias.  HCAP - Antibiotic management per ID. - finished total of 7 days of IV Zosyn and linezolid.  Acute diastolic CHF - Chest x-ray with evidence of small bilateral pleural effusion, and edema, 2D echo with a grade 2 diastolic CHF,  - Hold IV Lasix today given worsening renal function..  Coronary artery disease - Status postcardiac arrest on 12/30/2016, reviewed her records from [facility], she had another episode of cardiopulmonary arrest January of this year secondary to sepsis/acidosis secondary to her foot infection, no significant events on telemetry, no V. tach or malignant arrhythmias . - No malignant arrhythmias on telemetry, discussed with cardiology , these areas are related to other metabolic derangement from respiratory problems and metabolic problems, and unlikely related to heart disease especially in the setting of preserved EF , will start on low dose beta blockers, and replete electrolytes as needed . - Echocardiogram does not show any wall motion abnormality.  Acute on CKD stage IV - Creatinine increased to 3 .8 today, will hold IV Lasix  Uncontrolled diabetes type 2 complicated by diabetic foot ulcer and neuropathy -A1c 9.8 on 12/30/2016 -Continue sliding scale, CVG are uncontrolled, will increase Lantus from 10-16 units.  Anemia of chronic disease -  No need for transfusion at this time. No signs of active bleeding  Diabetes mellitus - CBG remains uncontrolled on insulin sliding scale, will start on low-dose Lantus  Left foot wound - Wound Care input appreciated  DVT prophylaxis: Subcutaneous heparin Code Status: Full Family Communication:  This with  husband and daughter at bedside Disposition Plan: Today's discharge has been canceled, given patient  presyncope.  Consultants:   Pulmonary  Infectious disease   Subjective: Was doing well this a.m., plan was for discharge, she had a presyncopal episode when she went to bathroom off oxygen where she was hypoxic,discharge has been canceled. Objective: Vitals:   01/05/17 2121 01/06/17 0442 01/06/17 0733 01/06/17 1048  BP: 137/64 122/65  (!) 130/55  Pulse: 100 96  100  Resp: 17 18    Temp: 98.8 F (37.1 C) 98.6 F (37 C)    TempSrc: Oral     SpO2: 91% 93% 92%   Weight:      Height:        Intake/Output Summary (Last 24 hours) at 01/06/17 1358 Last data filed at 01/06/17 0902  Gross per 24 hour  Intake              930 ml  Output             1400 ml  Net             -470 ml   Filed Weights   01/03/17 0400 01/04/17 1700 01/05/17 0500  Weight: 82.6 kg (182 lb) 81.5 kg (179 lb 10.8 oz) 81 kg (178 lb 9.2 oz)    Examination:  Awake alert 3, in no apparent distress Diminished air entry at the bases,  no wheezing,   Regular and rhythm, no rubs murmurs gallops Abdomen soft, nontender nondistended, bowel sounds present No edema, left foot with a transmetatarsal amputation.    Data Reviewed:   CBC:  Recent Labs Lab 01/01/17 0325 01/01/17 1552 01/02/17 0230 01/03/17 0512 01/05/17 0218  WBC 7.5 8.4 7.9 7.4 7.7  NEUTROABS  --   --   --  5.4  --   HGB 6.1* 7.6* 7.5* 8.2* 8.1*  HCT 19.2* 23.7* 23.9* 26.1* 25.9*  MCV 81.4 82.6 83.0 83.1 84.1  PLT 290 296 310 307 328   Basic Metabolic Panel:  Recent Labs Lab 12/30/16 2000  12/31/16 0635  01/02/17 0230 01/03/17 0512 01/04/17 1008 01/05/17 0218 01/06/17 0528  NA 132*  < >  --   < > 133* 130* 132* 130* 132*  K 4.8  < >  --   < > 3.8 3.5 4.0 4.6 4.6  CL 103  < >  --   < > 97* 93* 98* 97* 96*  CO2 19*  < >  --   < > 25 24 22 23 25   GLUCOSE 121*  < >  --   < > 120* 121* 199* 134* 256*  BUN 29*  < >  --   < > 31* 34* 35* 36* 44*  CREATININE 3.48*  < >  --   < > 3.37* 3.33* 3.58* 3.49* 3.85*  CALCIUM 7.8*  < >   --   < > 7.4* 7.5* 7.7* 7.8* 7.7*  MG 1.7  --  1.9  --   --  1.6* 1.7  --   --   PHOS 3.9  --  4.8*  --   --  3.9 4.0  --   --   < > = values in this interval not displayed. GFR: Estimated Creatinine Clearance: 18 mL/min (A) (by C-G formula based on SCr of 3.85 mg/dL (H)). Liver Function Tests:  Recent Labs Lab 01/02/17 0230  AST 18  ALT 19  ALKPHOS 122  BILITOT 0.5  PROT 5.9*  ALBUMIN 1.7*  No results for input(s): LIPASE, AMYLASE in the last 168 hours. No results for input(s): AMMONIA in the last 168 hours. Coagulation Profile: No results for input(s): INR, PROTIME in the last 168 hours. Cardiac Enzymes:  Recent Labs Lab 12/30/16 1903 12/31/16 0037 12/31/16 0635  TROPONINI 0.04* 0.05* 0.06*   BNP (last 3 results) No results for input(s): PROBNP in the last 8760 hours. HbA1C: No results for input(s): HGBA1C in the last 72 hours. CBG:  Recent Labs Lab 01/05/17 0054 01/05/17 1600 01/06/17 0719 01/06/17 1223 01/06/17 1329  GLUCAP 144* 312* 223* 278* 285*   Lipid Profile: No results for input(s): CHOL, HDL, LDLCALC, TRIG, CHOLHDL, LDLDIRECT in the last 72 hours. Thyroid Function Tests: No results for input(s): TSH, T4TOTAL, FREET4, T3FREE, THYROIDAB in the last 72 hours. Anemia Panel: No results for input(s): VITAMINB12, FOLATE, FERRITIN, TIBC, IRON, RETICCTPCT in the last 72 hours. Sepsis Labs:  Recent Labs Lab 12/31/16 0037 01/01/17 0325 01/03/17 0512  PROCALCITON 1.01 14.13 20.99  LATICACIDVEN 1.0  --   --     Recent Results (from the past 240 hour(s))  Culture, blood (routine x 2) Call MD if unable to obtain prior to antibiotics being given     Status: None   Collection Time: 12/30/16 12:54 PM  Result Value Ref Range Status   Specimen Description BLOOD RIGHT ARM  Final   Special Requests IN PEDIATRIC BOTTLE Blood Culture adequate volume  Final   Culture NO GROWTH 5 DAYS  Final   Report Status 01/04/2017 FINAL  Final  Culture, blood (routine x  2) Call MD if unable to obtain prior to antibiotics being given     Status: None   Collection Time: 12/30/16 12:54 PM  Result Value Ref Range Status   Specimen Description BLOOD RIGHT HAND  Final   Special Requests IN PEDIATRIC BOTTLE Blood Culture adequate volume  Final   Culture NO GROWTH 5 DAYS  Final   Report Status 01/04/2017 FINAL  Final  Urine culture     Status: None   Collection Time: 12/30/16  4:36 PM  Result Value Ref Range Status   Specimen Description URINE, RANDOM  Final   Special Requests NONE  Final   Culture NO GROWTH  Final   Report Status 12/31/2016 FINAL  Final  Urine culture     Status: None   Collection Time: 12/30/16  8:42 PM  Result Value Ref Range Status   Specimen Description URINE, CLEAN CATCH  Final   Special Requests Immunocompromised  Final   Culture NO GROWTH  Final   Report Status 01/01/2017 FINAL  Final  MRSA PCR Screening     Status: None   Collection Time: 12/30/16  8:44 PM  Result Value Ref Range Status   MRSA by PCR NEGATIVE NEGATIVE Final    Comment:        The GeneXpert MRSA Assay (FDA approved for NASAL specimens only), is one component of a comprehensive MRSA colonization surveillance program. It is not intended to diagnose MRSA infection nor to guide or monitor treatment for MRSA infections.   Respiratory Panel by PCR     Status: None   Collection Time: 12/31/16  4:50 PM  Result Value Ref Range Status   Adenovirus NOT DETECTED NOT DETECTED Final   Coronavirus 229E NOT DETECTED NOT DETECTED Final   Coronavirus HKU1 NOT DETECTED NOT DETECTED Final   Coronavirus NL63 NOT DETECTED NOT DETECTED Final   Coronavirus OC43 NOT DETECTED NOT DETECTED Final   Metapneumovirus  NOT DETECTED NOT DETECTED Final   Rhinovirus / Enterovirus NOT DETECTED NOT DETECTED Final   Influenza A NOT DETECTED NOT DETECTED Final   Influenza B NOT DETECTED NOT DETECTED Final   Parainfluenza Virus 1 NOT DETECTED NOT DETECTED Final   Parainfluenza Virus 2 NOT  DETECTED NOT DETECTED Final   Parainfluenza Virus 3 NOT DETECTED NOT DETECTED Final   Parainfluenza Virus 4 NOT DETECTED NOT DETECTED Final   Respiratory Syncytial Virus NOT DETECTED NOT DETECTED Final   Bordetella pertussis NOT DETECTED NOT DETECTED Final   Chlamydophila pneumoniae NOT DETECTED NOT DETECTED Final   Mycoplasma pneumoniae NOT DETECTED NOT DETECTED Final         Radiology Studies: Dg Chest Port 1 View  Result Date: 01/05/2017 CLINICAL DATA:  51 year old female with a history of respiratory failure EXAM: PORTABLE CHEST 1 VIEW COMPARISON:  01/03/2017, 01/02/2017 FINDINGS: Cardiomediastinal silhouette unchanged. Low lung volumes with opacities at the bilateral lung bases, slightly improved from the prior. Mild interlobular septal thickening bilaterally. Fullness in the central vascular. No pneumothorax. Obscuration of the left and right hemidiaphragms as well as the left heart border. No displaced fracture IMPRESSION: Slight improvement of bibasilar opacities, likely combination of edema, pleural effusion, atelectasis/consolidation. Electronically Signed   By: Gilmer Mor D.O.   On: 01/05/2017 07:41        Scheduled Meds: . collagenase   Topical Daily  . feeding supplement (PRO-STAT SUGAR FREE 64)  30 mL Oral BID  . gabapentin  600 mg Oral Daily  . heparin  5,000 Units Subcutaneous Q8H  . insulin aspart  0-15 Units Subcutaneous TID WC  . [START ON 01/07/2017] insulin glargine  16 Units Subcutaneous Daily  . insulin glargine  6 Units Subcutaneous Once  . ipratropium-albuterol  3 mL Nebulization TID  . mouth rinse  15 mL Mouth Rinse BID  . metoprolol tartrate  12.5 mg Oral BID  . multivitamin with minerals  1 tablet Oral Daily  . sodium chloride flush  3 mL Intravenous Q12H   Continuous Infusions: . linezolid (ZYVOX) IV Stopped (01/06/17 1150)  . piperacillin-tazobactam (ZOSYN)  IV Stopped (01/06/17 0904)     LOS: 7 days     Huey Bienenstock, MD [facility] Pager [phone]  If 7PM-7AM, please contact night-coverage [email or web address] Password [email or web address] 01/06/2017, 1:58 PM

## 2017-01-06 NOTE — Discharge Instructions (Signed)
Follow with Primary MD ([facility]) in 7 days   Get CBC, CMP, 2 view Chest X ray checked  by Primary MD next visit.    Activity: As tolerated with Full fall precautions use walker/cane & assistance as needed   Disposition Home    Diet: Heart Healthy ,carb modified with 1.5 L/day fluid restriction , with feeding assistance and aspiration precautions.  For Heart failure patients - Check your Weight same time everyday, if you gain over 2 pounds, or you develop in leg swelling, experience more shortness of breath or chest pain, call your Primary MD immediately. Follow Cardiac Low Salt Diet and 1.5 lit/day fluid restriction.   On your next visit with your primary care physician please Get Medicines reviewed and adjusted.   Please request your Prim.MD to go over all Hospital Tests and Procedure/Radiological results at the follow up, please get all Hospital records sent to your Prim MD by signing hospital release before you go home.   If you experience worsening of your admission symptoms, develop shortness of breath, life threatening emergency, suicidal or homicidal thoughts you must seek medical attention immediately by calling 911 or calling your MD immediately  if symptoms less severe.  You Must read complete instructions/literature along with all the possible adverse reactions/side effects for all the Medicines you take and that have been prescribed to you. Take any new Medicines after you have completely understood and accpet all the possible adverse reactions/side effects.   Do not drive, operating heavy machinery, perform activities at heights, swimming or participation in water activities or provide baby sitting services if your were admitted for syncope or siezures until you have seen by Primary MD or a Neurologist and advised to do so again.  Do not drive when taking Pain medications.    Do not take more than prescribed Pain, Sleep and Anxiety  Medications  Special Instructions: If you have smoked or chewed Tobacco  in the last 2 yrs please stop smoking, stop any regular Alcohol  and or any Recreational drug use.  Wear Seat belts while driving.   Please note  You were cared for by a hospitalist during your hospital stay. If you have any questions about your discharge medications or the care you received while you were in the hospital after you are discharged, you can call the unit and asked to speak with the hospitalist on call if the hospitalist that took care of you is not available. Once you are discharged, your primary care physician will handle any further medical issues. Please note that NO REFILLS for any discharge medications will be authorized once you are discharged, as it is imperative that you return to your primary care physician (or establish a relationship with a primary care physician if you do not have one) for your aftercare needs so that they can reassess your need for medications and monitor your lab values.

## 2017-01-06 NOTE — Progress Notes (Signed)
Pt. Found unresponsive in bathroom by nursing staff. Pt. Was moved back to bed and slowly became responsive again. MD paged. Rapid Response paged.   O2 was found to be off of pt. Sats were in low 70's. O2 placed back on pt. And O2 sats began to come up. VS (HR, bp,) were stable. Pt. Placed on 5L and sats remained in 90-92% range.   After getting pt. Stable, family said they had gotten her up to the bathroom but had taken her oxygen off. No nurse had been called to help pt. Nurse staff educated family on needing to call nursing staff to get pt. Up because pt. Cannot go without oxygen.

## 2017-01-06 NOTE — Progress Notes (Signed)
SATURATION QUALIFICATIONS: (This note is used to comply with regulatory documentation for home oxygen)  Patient Saturations on Room Air at Rest = N/A  Patient Saturations on Room Air while Ambulating = N/A  Patient Saturations on 6 Liters of oxygen while Ambulating = 95%  Please briefly explain why patient needs home oxygen: Patient SaO2 documented to drop below 70% on RA and was not attempted. Pt ambulates with SaO2 consistently above 90% on 6L O2 via nasal cannula. Pt SaO2 drops below 88% O2 on 4L with ambulation. Pt requires supplemental O2 to safely ambulate in her environment.  Jayshon Dommer B. Beverely RisenVan Fleet PT, DPT Acute Rehabilitation  [phone] Pager [phone]

## 2017-01-06 NOTE — Progress Notes (Signed)
Physical Therapy Treatment Patient Details Name: Cynthia DenseShiela Ann Martin Dafoe MRN: [id number] DOB: 07-28-1966 Today's Date: 01/06/2017    History of Present Illness 51 yo admitted from [facility] with PNA and respiratory difficulty. After transport to CT pt found unresponsive and hypoxic, code blue 5/18. PMhx: DM, left transmet, CKD, neuropathy, HTN, anemia    PT Comments    Pt is making progress towards her goals but is limited by fluctuations in SaO2. Pt currently mod I for bed mobility, supervision for transfers and min guard for ambulation of 200 feet with RW. Pt requires skilled PT to progress gait training and to improve LE strength and endurance to safely navigate in her home environment.      Follow Up Recommendations  Home health PT;Supervision for mobility/OOB     Equipment Recommendations  None recommended by PT    Recommendations for Other Services       Precautions / Restrictions Precautions Precautions: Fall Precaution Comments: watch sats closely Required Braces or Orthoses: Other Brace/Splint Other Brace/Splint: post op shoe left foot (does not like to wear it) Restrictions Weight Bearing Restrictions: No    Mobility  Bed Mobility Overal bed mobility: Modified Independent Bed Mobility: Supine to Sit     Supine to sit: Modified independent (Device/Increase time) Sit to supine: Modified independent (Device/Increase time)   General bed mobility comments: able to use bedrails to get into and out of bed  Transfers Overall transfer level: Needs assistance Equipment used: Standard walker Transfers: Sit to/from Stand;Stand Pivot Transfers Sit to Stand: Supervision Stand pivot transfers: Supervision       General transfer comment: supervision for safety, pt with safe transfers sit<>stand with RW  Ambulation/Gait Ambulation/Gait assistance: Min guard Ambulation Distance (Feet): 200 Feet Assistive device: Rolling walker (2 wheeled) Gait Pattern/deviations:  Step-through pattern;Decreased stance time - left;Decreased weight shift to left Gait velocity: decreased Gait velocity interpretation: Below normal speed for age/gender General Gait Details: cues for upright posture, safely increased cadence with walking       Balance Overall balance assessment: Needs assistance Sitting-balance support: Feet supported;No upper extremity supported Sitting balance-Leahy Scale: Good     Standing balance support: During functional activity Standing balance-Leahy Scale: Fair Standing balance comment: able to stand without assist to perform pericare                            Cognition Arousal/Alertness: Awake/alert Behavior During Therapy: WFL for tasks assessed/performed Overall Cognitive Status: Within Functional Limits for tasks assessed                                           General Comments General comments (skin integrity, edema, etc.): Pt on 4 L O2 via nasal cannula at entry with pt supine in bed SaO2 varied from 85-95% O2, with sitting up SaO2 on 4L increased to 90% O2, with transfer to BSC SaO2 dropped to 75% O2, Oxygen increased to 5L via nasal canula, and SaO2 rose to 90% O2 after 1 minute. Pt ambulated on 6 L O2 via nasal cannula and O2 remained at 92-95% O2.  O2 flow decreased to 4L and SaO2 dropped to 85% O2. O2 flow increased back to 6L and SaO2 remained above 90% O2 throughout the remainder of session.        Pertinent Vitals/Pain Pain Assessment: 0-10 Pain Score: 6  Pain  Location: chest and L foot Pain Descriptors / Indicators: Stabbing;Sore Pain Intervention(s): RN gave pain meds during session;Monitored during session;Limited activity within patient's tolerance  See general comments           PT Goals (current goals can now be found in the care plan section) Acute Rehab PT Goals PT Goal Formulation: With patient/family Time For Goal Achievement: 01/14/17 Potential to Achieve Goals:  Good Progress towards PT goals: Progressing toward goals    Frequency    Min 3X/week      PT Plan Current plan remains appropriate       AM-PAC PT "6 Clicks" Daily Activity  Outcome Measure  Difficulty turning over in bed (including adjusting bedclothes, sheets and blankets)?: A Little Difficulty moving from lying on back to sitting on the side of the bed? : A Little Difficulty sitting down on and standing up from a chair with arms (e.g., wheelchair, bedside commode, etc,.)?: A Little Help needed moving to and from a bed to chair (including a wheelchair)?: A Little Help needed walking in hospital room?: A Little Help needed climbing 3-5 steps with a railing? : A Little 6 Click Score: 18    End of Session Equipment Utilized During Treatment: Gait belt;Oxygen Activity Tolerance: Patient limited by pain (ambulation limited by L foot pain) Patient left: in bed;with call bell/phone within reach;with nursing/sitter in room;with family/visitor present Nurse Communication: Mobility status;Patient requests pain meds PT Visit Diagnosis: Other abnormalities of gait and mobility (R26.89);Difficulty in walking, not elsewhere classified (R26.2)     Time: 1610-9604 PT Time Calculation (min) (ACUTE ONLY): 23 min  Charges:  $Gait Training: 8-22 mins $Therapeutic Activity: 8-22 mins                    G Codes:       Latifa Noble B. Beverely Risen PT, DPT Acute Rehabilitation  [phone] Pager [phone]     Elon Alas Fleet 01/06/2017, 6:20 PM

## 2017-01-06 NOTE — Progress Notes (Signed)
Rapid Response called on pt who was found unresponsive and pale in bathroom.  Pt was in bed upon arrival on 100% NRB and sats were coming up.  Pt alert and talking and wears O2 at home of 4 lpm and has been on 4 lpm in hospital with sats ranging 89-93%.  Pt does desat very easily when [facility] [place] [facility].  Once sat back up in mid 90's switched her back to her 4 lpm [place] and sat stayed at 87-90% increased to 5 lpm and sat came up to 93%.

## 2017-01-06 NOTE — Care Management Note (Signed)
Case Management Note  Patient Details  Name: [name] MRN: [id number] Date of Birth: Jun 12, 1966  Subjective/Objective:                 [facility], [facility]. Spoke with husband and he denies need for DME. No other CM needs.   Action/Plan:  DC to home a HH.  Expected Discharge Date:  01/06/17               Expected Discharge Plan:  Home w Home Health Services  In-House Referral:  Clinical Social Work  Discharge planning Services  CM Consult  Post Acute Care Choice:  Home Health Choice offered to:  Spouse  DME Arranged:    DME Agency:     HH Arranged:  RN, Nurse's Aide HH Agency:  [facility]  Status of Service:  Completed, signed off  If discussed at Long Length of Stay Meetings, dates discussed:    Additional Comments:  Lawerance SabalDebbie Nathyn Luiz, RN 01/06/2017, 1:10 PM

## 2017-01-06 NOTE — Progress Notes (Signed)
Name: Cynthia Dillon MRN: [id number] DOB: Dec 27, 1965    ADMISSION DATE:  12/30/2016 CONSULTATION DATE:  5/25 (re-call)  REFERRING MD :  Dr. Randol Kern  CHIEF COMPLAINT:  Hypoxia    BRIEF SUMMARY:  51 y/o F transferred 5/18 to [facility] after suffering a cardiac arrest (6 minutes prior to ROSC, thought hypoxia related) in the setting of suspected PNA.  She was treated for PNA.  The patient improved on high flow O2 and antibiotics.  She had significant improvement in CXR after diuresis.  Pt was being groomed for discharge when she got up to go to the bathroom without her oxygen on and became hypoxic, resolved with reapplication of O2.    PMH:  CAD s/p MI, poorly controlled DM, transmetatarsal amputation, cardiac arrest, grade II diastolic dysfunction & pulmonary nodules (thought reactive).     SUBJECTIVE: RN reports sats normalized with O2 application.  Pt reports she is sore in her chest from CPR, doesn't want to take deep breaths.    VITAL SIGNS: Temp:  [98.6 F (37 C)-98.8 F (37.1 C)] 98.6 F (37 C) (05/25 0442) Pulse Rate:  [96-100] 100 (05/25 1048) Resp:  [17-18] 18 (05/25 0442) BP: (122-137)/(55-65) 130/55 (05/25 1048) SpO2:  [91 %-93 %] 92 % (05/25 0733) FiO2 (%):  [91 %] 91 % (05/24 2009)  PHYSICAL EXAMINATION: General:  Adult female in NAD, appears older than stated age HEENT: MM pink/moist PSY: calm/appropriate  Neuro: Awake, alert, MAE, normal strength  CV: s1s2 rrr, no m/r/g PULM: even/non-labored, lungs bilaterally with posterior crackles  WU:JWJX, non-tender, bsx4 active  Extremities: warm/dry, no edema, L transmetatarsal amputation Skin: no rashes or lesions    Recent Labs Lab 01/04/17 1008 01/05/17 0218 01/06/17 0528  NA 132* 130* 132*  K 4.0 4.6 4.6  CL 98* 97* 96*  CO2 22 23 25   BUN 35* 36* 44*  CREATININE 3.58* 3.49* 3.85*  GLUCOSE 199* 134* 256*     Recent Labs Lab 01/02/17 0230 01/03/17 0512 01/05/17 0218  HGB 7.5* 8.2* 8.1*    HCT 23.9* 26.1* 25.9*  WBC 7.9 7.4 7.7  PLT 310 307 328    Dg Chest Port 1 View  Result Date: 01/06/2017 CLINICAL DATA:  Hypoxia.  Chest pain . EXAM: PORTABLE CHEST 1 VIEW COMPARISON:  01/05/2017 . FINDINGS: Cardiomegaly with diffuse bilateral from interstitial prominence and bilateral pleural effusions. Findings consistent with CHF. Low lung volumes with basilar atelectasis. Bibasilar pneumonia cannot be excluded. No pneumothorax. Similar findings noted on prior exam . IMPRESSION: Cardiomegaly with diffuse bilateral interstitial edema and bilateral pleural effusions consistent CHF. Low lung volumes with basilar atelectasis. Bibasilar pneumonia cannot be excluded. Similar findings noted on prior exam. Electronically Signed   By: Maisie Fus  Register   On: 01/06/2017 13:59   Dg Chest Port 1 View  Result Date: 01/05/2017 CLINICAL DATA:  51 year old female with a history of respiratory failure EXAM: PORTABLE CHEST 1 VIEW COMPARISON:  01/03/2017, 01/02/2017 FINDINGS: Cardiomediastinal silhouette unchanged. Low lung volumes with opacities at the bilateral lung bases, slightly improved from the prior. Mild interlobular septal thickening bilaterally. Fullness in the central vascular. No pneumothorax. Obscuration of the left and right hemidiaphragms as well as the left heart border. No displaced fracture IMPRESSION: Slight improvement of bibasilar opacities, likely combination of edema, pleural effusion, atelectasis/consolidation. Electronically Signed   By: Gilmer Mor D.O.   On: 01/05/2017 07:41    SIGNIFICANT EVENTS:  5/18  tx to [facility], cardiac arrest hypoxia related 6 minutes to ROSC.  5/19  ambulatory no distress  5/20  cxr worse. Increased O2 needs 5/21  [facility]  PCCM called back for hypoxia > pt had gone to restroom without O2 on, desaturated   STUDIES:  CT Chest w/o 5/18 >> small bilateral pleural effusions with extensive bilateral lower lobe consolidation with additional GGO and  multifocal consolidation in the upper lobes, bilateral pulmonary nodules (thought to be inflammatory or infectious)  ECHO 5/19 >> normal LV, no RWMA, grade II diastolic dysfunction, mild MVR, PA peak pressure 35  CULTURES: BCx2 5/18 >> neg Sputum 5/18 >> neg  UC 5/18 >> negative RVP 5/19 >> negative  ANTIBIOTICS: Primaxin x1 5/18 Zosyn 5/18 >>  Azithromycin 5/18 x1 Zyvox 5/20 >>   ASSESSMENT / PLAN:  Discussion:  51 y/o F with obesity, poorly controlled DM admitted after cardiac arrest in setting of PNA (treated with abx).  Work up found her to have grade II diastolic dysfunction.  LE venous duplex negative for DVT.  Unable to do CTA due to renal function.  CXR improved with diuresis.  Was pending d/c when she got up without her O2 and became hypoxic.  Suspect this is multifactorial in the setting of PNA, post cardiac arrest with splinting / atelectasis, and diastolic dysfunction with pulmonary edema.       Acute on Chronic Hypoxic Respiratory Failure - previously on O2 after a prior cardiac arrest.   Pre-Syncope - suspect related to hypoxia, no telemetry changes during event per RN HCAP  Diastolic Dysfunction   Plan: Continue O2 to keep saturations 90-95% Push pulmonary hygiene - IS, mobilize Follow intermittent CXR  Diuresis as renal function / BP permits She will need cardiac evaluation at some point given two prior arrests  Follow up with PCP regarding weaning of O2 after discharge.Anticipate as she recovers from this injury her O2 needs will improve but it will take some time She will need an ambulatory O2 assessment to see what her exertional O2 needs are prior to d/c ABX per ID, consider 7-10 days for HCAP.  D8/x abx.  She is >24 hours afebrile and WBC wnl.      Canary BrimBrandi Karnell Vanderloop, NP-C [facility] Pgr: [phone] or if no answer [phone] 01/06/2017, 2:02 PM

## 2017-01-06 NOTE — Significant Event (Addendum)
Rapid Response Event Note  Overview:  RRT page Time Called: 1325 Arrival Time: 1330 Event Type: Respiratory  Initial Focused Assessment:  Responded to RRT page.  On my arrival to patients room, RN, RT and family at bedside.  As per RN, family notified RN that patient was in the bathroom unresponsive off oxygen. sats were noted to be in the 70's on RA  Patient was in bed on my evaluation, on NRM sats 100%. Awake alert and oriented.  Patient states she went to bathroom to get dressed since she is being discharged and the the oxygen tubing could not reach the bathroom.   Interventions:  RT placed patient back on her nasal cannula at 4 LPm sats 90-92%.  Patient instructed that she needs to wear her oxygen at all times.  MD at bedside  Plan of Care (if not transferred):  Event Summary:  RN to call if assistance needed   at      at          [facility], IncorporatedWolfe, Maryagnes Amosenise Ann

## 2017-01-07 ENCOUNTER — Inpatient Hospital Stay (HOSPITAL_COMMUNITY): Payer: Medicaid Other

## 2017-01-07 ENCOUNTER — Encounter (HOSPITAL_COMMUNITY): Payer: Self-pay | Admitting: *Deleted

## 2017-01-07 DIAGNOSIS — R509 Fever, unspecified: Secondary | ICD-10-CM

## 2017-01-07 LAB — GLUCOSE, CAPILLARY
GLUCOSE-CAPILLARY: 188 mg/dL — AB (ref 65–99)
GLUCOSE-CAPILLARY: 227 mg/dL — AB (ref 65–99)
GLUCOSE-CAPILLARY: 240 mg/dL — AB (ref 65–99)
Glucose-Capillary: 191 mg/dL — ABNORMAL HIGH (ref 65–99)
Glucose-Capillary: 241 mg/dL — ABNORMAL HIGH (ref 65–99)
Glucose-Capillary: 271 mg/dL — ABNORMAL HIGH (ref 65–99)
Glucose-Capillary: 290 mg/dL — ABNORMAL HIGH (ref 65–99)

## 2017-01-07 LAB — BASIC METABOLIC PANEL
ANION GAP: 10 (ref 5–15)
BUN: 44 mg/dL — ABNORMAL HIGH (ref 6–20)
CALCIUM: 7.7 mg/dL — AB (ref 8.9–10.3)
CHLORIDE: 97 mmol/L — AB (ref 101–111)
CO2: 24 mmol/L (ref 22–32)
Creatinine, Ser: 3.81 mg/dL — ABNORMAL HIGH (ref 0.44–1.00)
GFR calc non Af Amer: 13 mL/min — ABNORMAL LOW (ref >60)
GFR, EST AFRICAN AMERICAN: 15 mL/min — AB (ref >60)
Glucose, Bld: 196 mg/dL — ABNORMAL HIGH (ref 65–99)
Potassium: 4.8 mmol/L (ref 3.5–5.1)
Sodium: 131 mmol/L — ABNORMAL LOW (ref 135–145)

## 2017-01-07 MED ORDER — TECHNETIUM TO 99M ALBUMIN AGGREGATED
4.0900 | Freq: Once | INTRAVENOUS | Status: AC | PRN
  Administered 2017-01-07: 4.09 via INTRAVENOUS

## 2017-01-07 MED ORDER — AMOXICILLIN-POT CLAVULANATE 500-125 MG PO TABS: 1.0000 | ORAL_TABLET | Freq: Two times a day (BID) | ORAL | 0 refills | Status: DC

## 2017-01-07 MED ORDER — AMOXICILLIN-POT CLAVULANATE 875-125 MG PO TABS: 1.0000 | ORAL_TABLET | Freq: Two times a day (BID) | ORAL | Status: DC

## 2017-01-07 MED ORDER — INSULIN GLARGINE 100 UNIT/ML ~~LOC~~ SOLN
18.0000 [IU] | Freq: Every day | SUBCUTANEOUS | Status: DC
  Administered 2017-01-07 – 2017-01-08 (×2): 18 [IU] via SUBCUTANEOUS
  Filled 2017-01-07 (×2): qty 0.18

## 2017-01-07 MED ORDER — AMOXICILLIN-POT CLAVULANATE 500-125 MG PO TABS
1.0000 | ORAL_TABLET | Freq: Two times a day (BID) | ORAL | Status: DC
  Administered 2017-01-07 – 2017-01-08 (×2): 500 mg via ORAL
  Filled 2017-01-07 (×2): qty 1

## 2017-01-07 MED ORDER — TECHNETIUM TC 99M DIETHYLENETRIAME-PENTAACETIC ACID
30.5000 | Freq: Once | INTRAVENOUS | Status: AC | PRN
  Administered 2017-01-07: 30.5 via RESPIRATORY_TRACT

## 2017-01-07 MED ORDER — IOPAMIDOL (ISOVUE-300) INJECTION 61%
INTRAVENOUS | Status: AC
  Filled 2017-01-07: qty 30

## 2017-01-07 NOTE — Progress Notes (Signed)
PROGRESS NOTE    Cynthia Dillon  ZOX:[id number] DOB: 02/21/1966 DOA: 12/30/2016 PCP: System, Pcp Not In   Brief Narrative:  51 year old female with poorly controlled diabetes, coronary artery disease status post MI, transmetatarsal amputation came to [facility] last week with complaints of nonproductive cough, fevers and chills along with hypoxia. CAT scan showed pneumonia therefore started on Levaquin and amoxicillin. Clinical status continued to worsen therefore antibiotics were changed to vancomycin and Zosyn. Due to acute renal failure she was switched to imipenem. Initially [facility] she was noted to elevated BNP. C. difficile was checked which was negative. Unfortunately during her stay she had a cardiac arrest/PEA therefore CPR was performed and return of spontaneous circulation after 6 minutes. She was transferred to the ICU for closer monitoring.   Assessment & Plan:   Principal Problem:   Respiratory failure, acute (HCC) Active Problems:   DM (diabetes mellitus), type 2 with peripheral vascular complications (HCC)   Diabetic foot ulcer associated with diabetes mellitus due to underlying condition (HCC)   Multifocal pneumonia   Acute renal failure superimposed on stage 3 chronic kidney disease (HCC)   Cardiac arrest (HCC)  Acute hypoxic respiratory failure - Multifactorial, secondary to pneumonia, and volume overload. - Patient reports she has home oxygen, but did not require any oxygen  for the last 3-4 month - Appears to be improving with antibiotic on diuresis, she finished her antibiotic 5/24, diuresis on hold given worsening renal function. - Currently she is on 4-5 L nasal cannula, PCCM input, she continues to improve, and it will take time to fully resolve, will need intermittent chest x-ray to ensure clearing of infiltrates . - CT chest was obtained today regarding her hypoxia, showing stable to better infiltrate, and improving right upper infiltrate . - VQ scan was  obtained today, giving her recurrent fever, hypoxia, presyncope, slow probability for PE .  Presyncope - On 5/25, secondary to hypoxia as she was noted not wearing her oxygen, telemetry.  HCAP - Antibiotic management per ID. - finished total of 7 days of IV Zosyn and linezolid. Giving him a recurrent fever, will start on Augmentin for total 5 days.  Recurrent fever - She is nontoxic appearing, she is with fever 101.5 today, CT chest showing stable pneumonia, discussed with ID, we'll obtain CT abdomen pelvis to rule out any infectious process.  Acute diastolic CHF - Chest x-ray with evidence of small bilateral pleural effusion, and edema, 2D echo with a grade 2 diastolic CHF,  - Hold IV Lasix today given worsening renal function..  Coronary artery disease - Status postcardiac arrest on 12/30/2016, reviewed her records from [facility], she had another episode of cardiopulmonary arrest January of this year secondary to sepsis/acidosis secondary to her foot infection, no significant events on telemetry, no V. tach or malignant arrhythmias . - No malignant arrhythmias on telemetry, discussed with cardiology , these areas are related to other metabolic derangement from respiratory problems and metabolic problems, and unlikely related to heart disease especially in the setting of preserved EF , will start on low dose beta blockers, and replete electrolytes as needed . - Echocardiogram does not show any wall motion abnormality.  Acute on CKD stage IV - Creatinine increased to 3 .8 today, will hold IV Lasix  Uncontrolled diabetes type 2 complicated by diabetic foot ulcer and neuropathy -A1c 9.8 on 12/30/2016 -Continue sliding scale, CVG are uncontrolled, will increase Lantus from 10-16 units.  Anemia of chronic disease -  No need for transfusion  at this time. No signs of active bleeding  Diabetes mellitus - CBG remains uncontrolled on insulin sliding scale, will start on low-dose Lantus  Left  foot wound - Wound Care input appreciated  DVT prophylaxis: Subcutaneous heparin Code Status: Full Family Communication:  This with husband and daughter at bedside Disposition Plan: Today's discharge has been canceled, given patient presyncope.  Consultants:   Pulmonary  Infectious disease   Subjective: She denies any complaints, had fever 101.5 this a.m., denies any worsening dyspnea, denies any cough, no nausea, no vomiting, no chest pain . Objective: Vitals:   01/07/17 0516 01/07/17 1310 01/07/17 1404 01/07/17 1510  BP: (!) 119/58 137/62 128/76   Pulse: (!) 102 84 83   Resp: 17 18 18    Temp: (!) 101.5 F (38.6 C) 98 F (36.7 C) 98 F (36.7 C)   TempSrc: Oral Oral Oral   SpO2: 92% 97% 98% 97%  Weight: 79.6 kg (175 lb 7.4 oz)     Height:        Intake/Output Summary (Last 24 hours) at 01/07/17 1746 Last data filed at 01/07/17 1401  Gross per 24 hour  Intake              720 ml  Output             1500 ml  Net             -780 ml   Filed Weights   01/04/17 1700 01/05/17 0500 01/07/17 0516  Weight: 81.5 kg (179 lb 10.8 oz) 81 kg (178 lb 9.2 oz) 79.6 kg (175 lb 7.4 oz)    Examination:  Awake alert oriented 3, no apparent distress  Diminished air entry at the bases, no wheezing  Regular and rhythm, no rubs murmurs gallops Abdomen soft, nontender, nondistended, bowel sounds present  No edema, left foot with a transmetatarsal amputation.    Data Reviewed:   CBC:  Recent Labs Lab 01/01/17 0325 01/01/17 1552 01/02/17 0230 01/03/17 0512 01/05/17 0218  WBC 7.5 8.4 7.9 7.4 7.7  NEUTROABS  --   --   --  5.4  --   HGB 6.1* 7.6* 7.5* 8.2* 8.1*  HCT 19.2* 23.7* 23.9* 26.1* 25.9*  MCV 81.4 82.6 83.0 83.1 84.1  PLT 290 296 310 307 328   Basic Metabolic Panel:  Recent Labs Lab 01/03/17 0512 01/04/17 1008 01/05/17 0218 01/06/17 0528 01/07/17 0443  NA 130* 132* 130* 132* 131*  K 3.5 4.0 4.6 4.6 4.8  CL 93* 98* 97* 96* 97*  CO2 24 22 23 25 24     GLUCOSE 121* 199* 134* 256* 196*  BUN 34* 35* 36* 44* 44*  CREATININE 3.33* 3.58* 3.49* 3.85* 3.81*  CALCIUM 7.5* 7.7* 7.8* 7.7* 7.7*  MG 1.6* 1.7  --   --   --   PHOS 3.9 4.0  --   --   --    GFR: Estimated Creatinine Clearance: 18 mL/min (A) (by C-G formula based on SCr of 3.81 mg/dL (H)). Liver Function Tests:  Recent Labs Lab 01/02/17 0230  AST 18  ALT 19  ALKPHOS 122  BILITOT 0.5  PROT 5.9*  ALBUMIN 1.7*   No results for input(s): LIPASE, AMYLASE in the last 168 hours. No results for input(s): AMMONIA in the last 168 hours. Coagulation Profile: No results for input(s): INR, PROTIME in the last 168 hours. Cardiac Enzymes: No results for input(s): CKTOTAL, CKMB, CKMBINDEX, TROPONINI in the last 168 hours. BNP (last 3 results) No results  for input(s): PROBNP in the last 8760 hours. HbA1C: No results for input(s): HGBA1C in the last 72 hours. CBG:  Recent Labs Lab 01/07/17 0016 01/07/17 0507 01/07/17 0750 01/07/17 1211 01/07/17 1604  GLUCAP 227* 188* 191* 290* 271*   Lipid Profile: No results for input(s): CHOL, HDL, LDLCALC, TRIG, CHOLHDL, LDLDIRECT in the last 72 hours. Thyroid Function Tests: No results for input(s): TSH, T4TOTAL, FREET4, T3FREE, THYROIDAB in the last 72 hours. Anemia Panel: No results for input(s): VITAMINB12, FOLATE, FERRITIN, TIBC, IRON, RETICCTPCT in the last 72 hours. Sepsis Labs:  Recent Labs Lab 01/01/17 0325 01/03/17 0512  PROCALCITON 14.13 20.99    Recent Results (from the past 240 hour(s))  Culture, blood (routine x 2) Call MD if unable to obtain prior to antibiotics being given     Status: None   Collection Time: 12/30/16 12:54 PM  Result Value Ref Range Status   Specimen Description BLOOD RIGHT ARM  Final   Special Requests IN PEDIATRIC BOTTLE Blood Culture adequate volume  Final   Culture NO GROWTH 5 DAYS  Final   Report Status 01/04/2017 FINAL  Final  Culture, blood (routine x 2) Call MD if unable to obtain prior to  antibiotics being given     Status: None   Collection Time: 12/30/16 12:54 PM  Result Value Ref Range Status   Specimen Description BLOOD RIGHT HAND  Final   Special Requests IN PEDIATRIC BOTTLE Blood Culture adequate volume  Final   Culture NO GROWTH 5 DAYS  Final   Report Status 01/04/2017 FINAL  Final  Urine culture     Status: None   Collection Time: 12/30/16  4:36 PM  Result Value Ref Range Status   Specimen Description URINE, RANDOM  Final   Special Requests NONE  Final   Culture NO GROWTH  Final   Report Status 12/31/2016 FINAL  Final  Urine culture     Status: None   Collection Time: 12/30/16  8:42 PM  Result Value Ref Range Status   Specimen Description URINE, CLEAN CATCH  Final   Special Requests Immunocompromised  Final   Culture NO GROWTH  Final   Report Status 01/01/2017 FINAL  Final  MRSA PCR Screening     Status: None   Collection Time: 12/30/16  8:44 PM  Result Value Ref Range Status   MRSA by PCR NEGATIVE NEGATIVE Final    Comment:        The GeneXpert MRSA Assay (FDA approved for NASAL specimens only), is one component of a comprehensive MRSA colonization surveillance program. It is not intended to diagnose MRSA infection nor to guide or monitor treatment for MRSA infections.   Respiratory Panel by PCR     Status: None   Collection Time: 12/31/16  4:50 PM  Result Value Ref Range Status   Adenovirus NOT DETECTED NOT DETECTED Final   Coronavirus 229E NOT DETECTED NOT DETECTED Final   Coronavirus HKU1 NOT DETECTED NOT DETECTED Final   Coronavirus NL63 NOT DETECTED NOT DETECTED Final   Coronavirus OC43 NOT DETECTED NOT DETECTED Final   Metapneumovirus NOT DETECTED NOT DETECTED Final   Rhinovirus / Enterovirus NOT DETECTED NOT DETECTED Final   Influenza A NOT DETECTED NOT DETECTED Final   Influenza B NOT DETECTED NOT DETECTED Final   Parainfluenza Virus 1 NOT DETECTED NOT DETECTED Final   Parainfluenza Virus 2 NOT DETECTED NOT DETECTED Final    Parainfluenza Virus 3 NOT DETECTED NOT DETECTED Final   Parainfluenza Virus 4 NOT DETECTED  NOT DETECTED Final   Respiratory Syncytial Virus NOT DETECTED NOT DETECTED Final   Bordetella pertussis NOT DETECTED NOT DETECTED Final   Chlamydophila pneumoniae NOT DETECTED NOT DETECTED Final   Mycoplasma pneumoniae NOT DETECTED NOT DETECTED Final         Radiology Studies: Ct Chest Wo Contrast  Result Date: 01/07/2017 CLINICAL DATA:  Fever and congestion. EXAM: CT CHEST WITHOUT CONTRAST TECHNIQUE: Multidetector CT imaging of the chest was performed following the standard protocol without IV contrast. COMPARISON:  CT scan Dec 30, 2016.  Chest x-ray Jan 06, 2017. FINDINGS: Cardiovascular: The heart is unchanged. The central great vessels are normal in caliber. Mediastinum/Nodes: Shotty nodes in the mediastinum are likely reactive. The esophagus and thyroid are unchanged and unremarkable. Lungs/Pleura: Central airways are normal. No pneumothorax. Small bilateral pleural effusions with bibasilar consolidations remain, similar in the interval. Scattered upper lobe opacities have improved. No overt edema. Scattered pulmonary nodules described on the previous CT scan. Please see that report. No suspicious masses. Upper Abdomen: No acute abnormality. Musculoskeletal: No chest wall mass or suspicious bone lesions identified. IMPRESSION: 1. Small bilateral pleural effusions with associated lower lobe consolidations, similar in the interval, worrisome for pneumonia. Upper lobe scattered opacities have improved. No overt edema. 2. Scattered pulmonary nodules fully described on the previous CT scan. Please see that report. No change. Electronically Signed   By: Gerome Sam III M.D   On: 01/07/2017 09:12   Nm Pulmonary Perf And Vent  Result Date: 01/07/2017 CLINICAL DATA:  Hypoxia EXAM: NUCLEAR MEDICINE VENTILATION - PERFUSION LUNG SCAN TECHNIQUE: Ventilation images were obtained in multiple projections using  inhaled aerosol Tc-25m DTPA. Perfusion images were obtained in multiple projections after intravenous injection of Tc-43m MAA. RADIOPHARMACEUTICALS:  30.5 mCi Technetium-47m DTPA aerosol inhalation and 4.09 mCi Technetium-50m MAA IV COMPARISON:  None Correlation:  Chest radiograph 01/06/2017 FINDINGS: Ventilation: Central every deposition of aerosol. Diminished aerosol distribution to the periphery of the upper lobes. No discrete segmental or subsegmental ventilation defects Perfusion: Normal Chest radiograph:  CHF IMPRESSION: Normal perfusion lung scan. Electronically Signed   By: Ulyses Southward M.D.   On: 01/07/2017 12:18   Dg Chest Port 1 View  Result Date: 01/06/2017 CLINICAL DATA:  Hypoxia.  Chest pain . EXAM: PORTABLE CHEST 1 VIEW COMPARISON:  01/05/2017 . FINDINGS: Cardiomegaly with diffuse bilateral from interstitial prominence and bilateral pleural effusions. Findings consistent with CHF. Low lung volumes with basilar atelectasis. Bibasilar pneumonia cannot be excluded. No pneumothorax. Similar findings noted on prior exam . IMPRESSION: Cardiomegaly with diffuse bilateral interstitial edema and bilateral pleural effusions consistent CHF. Low lung volumes with basilar atelectasis. Bibasilar pneumonia cannot be excluded. Similar findings noted on prior exam. Electronically Signed   By: Maisie Fus  Register   On: 01/06/2017 13:59        Scheduled Meds: . amoxicillin-clavulanate  1 tablet Oral Q12H  . collagenase   Topical Daily  . feeding supplement (PRO-STAT SUGAR FREE 64)  30 mL Oral BID  . gabapentin  600 mg Oral Daily  . heparin  5,000 Units Subcutaneous Q8H  . insulin aspart  0-15 Units Subcutaneous TID WC  . insulin glargine  18 Units Subcutaneous Daily  . iopamidol      . ipratropium-albuterol  3 mL Nebulization TID  . mouth rinse  15 mL Mouth Rinse BID  . metoprolol tartrate  12.5 mg Oral BID  . multivitamin with minerals  1 tablet Oral Daily  . sodium chloride flush  3 mL Intravenous  Q12H   Continuous Infusions:    LOS: 8 days     Huey BienenstockELGERGAWY, Kareen Hitsman, MD [facility] Pager [phone]  If 7PM-7AM, please contact night-coverage [email or web address] Password [email or web address] 01/07/2017, 5:46 PM

## 2017-01-08 LAB — GLUCOSE, CAPILLARY
GLUCOSE-CAPILLARY: 253 mg/dL — AB (ref 65–99)
Glucose-Capillary: 228 mg/dL — ABNORMAL HIGH (ref 65–99)

## 2017-01-08 MED ORDER — AMOXICILLIN-POT CLAVULANATE 500-125 MG PO TABS: 1.0000 | ORAL_TABLET | Freq: Two times a day (BID) | ORAL | 0 refills | Status: AC

## 2017-01-08 MED ORDER — INSULIN GLARGINE 100 UNIT/ML ~~LOC~~ SOLN: 22.0000 [IU] | Freq: Every day | SUBCUTANEOUS | 2 refills | Status: AC

## 2017-01-08 NOTE — Progress Notes (Addendum)
Patient discharged to home with instructions and prescriptions. 

## 2017-01-08 NOTE — Discharge Summary (Signed)
Cynthia Dillon, is a 51 y.o. female  DOB 1966-04-02  MRN [id number].  Admission date:  12/30/2016  Admitting Physician  Lahoma Crocker, MD  Discharge Date:  01/08/2017   Primary MD  System, Pcp Not In  Recommendations for primary care physician for things to follow:  - Please check CBC, BMP during next visit, to ensure stable renal function, creatinine 3.8 on discharge. No Lasix has been given on discharge, may consider low dose Lasix if renal function continues to improve and she develops volume overload. I have scheduled an appointment for patient with her PCP this am and Friday at 11 AM. - Patient will need sleep study as an outpatient, as she is most likely having obstructive sleep apnea. - Patient will need to reschedule her renal follow-up appointment as she missed previous one given she was hospitalized.  Admission Diagnosis  pneumonia   Discharge Diagnosis  pneumonia   Principal Problem:   Respiratory failure, acute (HCC) Active Problems:   DM (diabetes mellitus), type 2 with peripheral vascular complications (HCC)   Diabetic foot ulcer associated with diabetes mellitus due to underlying condition (HCC)   Multifocal pneumonia   Acute renal failure superimposed on stage 3 chronic kidney disease (HCC)   Cardiac arrest [facility])      Past Medical History:  Diagnosis Date  . Anemia   . Chronic kidney disease    "problems peeing" (12/30/2016)  . Depression   . Diabetes (HCC)    "dx'd age 29 when I had my 1st baby; always took shots when I was pregnant; didn't take them between pregnancies; started taking shots again ~ 3 yr ago"  . Heart murmur   . History of blood transfusion    "blood count was low" (12/30/2016)  . Hypertension   . Migraine    "~ q 3 days" (12/30/2016)  . Myocardial infarction [facility]) ~ 10/2016   "[facility]"  . On home oxygen therapy    "2-3L prn; aien't  wore it in ~ 3 months" (12/30/2016)  . Pneumonia    "now & 1-2 times before" (12/30/2016)  . Short-term memory loss    "short term memory loss for the last year" (12/30/2016)    Past Surgical History:  Procedure Laterality Date  . CHOLECYSTECTOMY OPEN    . FOOT AMPUTATION THROUGH METATARSAL Left 02/15/2014       History of present illness and  Hospital Course:     Kindly see H&P for history of present illness and admission details, please review complete Labs, Consult reports and Test reports for all details in brief  HPI  from the history and physical done on the day of admission 12/30/2016 HPI: Cynthia Dillon is a 51 y.o. 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 She presented to [facility] with complaints of nausea and vomiting and was found to have increased oxygen requirement. She felt  mildly short of breath but does use nocturnal home oxygen for an unknown reason. CT scan showed pneumonia and she was admitted into the hospital initially using Levaquin and oxacillin. Unfortunately her clinical status worsened she required more oxygen and started having fevers. Antibiotics were changed to vancomycin and Zosyn. She did continue to have fevers throughout the next 24 hours and could not be taken off of her 100% nonrebreather. Her creatinine worsened and she developed acute on chronic renal failure with a creatinine as high as 3.02. She was switched to imipenem today and given her worsening clinical status she was accepted in transfer [facility] for infectious diseases and pulmonary with possible renal evaluation. Cultures [facility] are pending   Hospital Course  51 year old female with poorly controlled diabetes, coronary artery disease status post MI, transmetatarsal amputation came [facility] week with complaints of nonproductive cough, fevers and chills along with hypoxia. CAT scan showed pneumonia therefore started on Levaquin and amoxicillin. Clinical status continued to worsen therefore antibiotics were changed to vancomycin and Zosyn. Due to acute renal failure she was switched to imipenem. Initially [facility] she was noted to elevated BNP. C. difficile was checked which was negative. Unfortunately during her stay she had a cardiac arrest/PEA therefore CPR was performed and return of spontaneous circulation after 6 minutes. She was transferred to the ICU for closer monitoring. She continues to improve, back to 4 L nasal cannula.  Acute hypoxic respiratory failure - Multifactorial, secondary to pneumonia, and volume overload. - Patient reports she has home oxygen, but did not require any oxygen  for the last 3-4 month -  improving with antibiotic on diuresis, currently she is on 4 L nasal cannula, , but she does have worsening hypoxia when she sees at night, so most likely she has undiagnosed OSA , she will need sleep study in outpatient setting . - CT chest was obtained yesterday regarding her hypoxia, showing stable to better infiltrate, and improving right upper infiltrate . - VQ scan was obtained today, giving her recurrent fever, hypoxia, presyncope, low probability for PE .  HCAP - Antibiotic management per ID. - Finish total of 7 days of IV Zosyn and linezolid during hospital stay, he continues to have intermittent fever during hospital stay, so we'll discharge on 5 days of oral Augmentin  Acute diastolic CHF - Chest x-ray with evidence of small bilateral pleural effusion, and edema, 2D echo with a grade 2 diastolic CHF,  treated with IV diuresis during hospital stay, currently no significant evidence of volume overload, will not be discharging on any Lasix as an outpatient giving him in her creatinine, it is 3.8 today, Has been averaging 3.5 during hospital  stay  Presyncope - On 5/25, secondary to hypoxia as she was noted not wearing her oxygen, telemetry  Recurrent fever - She is nontoxic appearing, he is afebrile over last 24 hours, but continues to have intermittent fever during hospital stay, it was felt secondary to atelectasis, she was encouraged to use incentive spirometry and flutter valve and take at home with her, repeat CT chest showing table/improving pneumonia, VQ scan negative for PE, even obtain CT abdomen pelvis with no acute findings or infectious process .  Coronary artery disease - Status postcardiac arrest on 12/30/2016, reviewed her records from [facility], she had another episode of cardiopulmonary arrest January of this year secondary to sepsis/acidosis secondary to her foot infection, no significant events on telemetry, no V. tach or malignant arrhythmias . -  No malignant arrhythmias on telemetry, discussed with cardiology , these areas are related to other metabolic derangement from respiratory problems and metabolic problems, and unlikely related to heart disease especially in the setting of preserved EF , will start on low dose beta blockers,  Will discharge metoprolol 12.5 mg twice a day - Echocardiogram does not show any wall motion abnormality.  CKG stage IV -Avoid nephrotoxic drugs. Closely monitor renal function, routine baseline creatinine during hospital stay is 3.5, has bumped to 3.8 at day of discharge, but this is secondary to overdiuresis, no volume overload at time of discharge, so no diuresis will be given, she was instructed to follow with her PCP next week for repeat labs, and to schedule her renal appointment as she missed it last week  when she was hospitalized .  Uncontrolled diabetes type 2 complicated by diabetic foot ulcer and neuropathy - A1c 9.8 on 12/30/2016, will discharge on Lantus 22 units subcutaneous daily  Anemia of chronic disease -  No need for transfusion at this time. No signs of active  bleeding  Left foot wound - Wound Care input appreciated   Discharge Condition:  stable   Follow UP  Follow-up Information    [facility] Follow up.   Why:  [place] scheduled for this coming Friday, June 1st at 11 AM Contact information: [address] [place], [place] [address]       Hours: Open  Closes 1PM                    [address] [place], [place] [address]  [phone]         [facility] Follow up.   Contact information: [address] [place] [address] [phone]             Discharge Instructions  and  Discharge Medications     Discharge Instructions    Diet - low sodium heart healthy    Complete by:  As directed    Discharge instructions    Complete by:  As directed    Follow with Primary MD ([facility]) in 7 days   Get CBC, CMP, 2 view Chest X ray checked  by Primary MD next visit.    Activity: As tolerated with Full fall precautions use walker/cane & assistance as needed   Disposition Home    Diet: Heart Healthy ,carb modified with 1.5 L/day fluid restriction , with feeding assistance and aspiration precautions.  For Heart failure patients - Check your Weight same time everyday, if you gain over 2 pounds, or you develop in leg swelling, experience more shortness of breath or chest pain, call your Primary MD immediately. Follow Cardiac Low Salt Diet and 1.5 lit/day fluid restriction.   On your next visit with your primary care physician please Get Medicines reviewed and adjusted.   Please request your Prim.MD to go over all Hospital Tests and Procedure/Radiological results at the follow up, please get all Hospital records sent to your Prim MD by signing hospital release before you go home.   If you experience worsening of your admission symptoms, develop shortness of breath, life threatening emergency, suicidal or homicidal thoughts you must seek  medical attention immediately by calling 911 or calling your MD immediately  if symptoms less severe.  You Must read complete instructions/literature along with all the possible adverse reactions/side effects for all the Medicines you take and that have  been prescribed to you. Take any new Medicines after you have completely understood and accpet all the possible adverse reactions/side effects.   Do not drive, operating heavy machinery, perform activities at heights, swimming or participation in water activities or provide baby sitting services if your were admitted for syncope or siezures until you have seen by Primary MD or a Neurologist and advised to do so again.  Do not drive when taking Pain medications.    Do not take more than prescribed Pain, Sleep and Anxiety Medications  Special Instructions: If you have smoked or chewed Tobacco  in the last 2 yrs please stop smoking, stop any regular Alcohol  and or any Recreational drug use.  Wear Seat belts while driving.   Please note  You were cared for by a hospitalist during your hospital stay. If you have any questions about your discharge medications or the care you received while you were in the hospital after you are discharged, you can call the unit and asked to speak with the hospitalist on call if the hospitalist that took care of you is not available. Once you are discharged, your primary care physician will handle any further medical issues. Please note that NO REFILLS for any discharge medications will be authorized once you are discharged, as it is imperative that you return to your primary care physician (or establish a relationship with a primary care physician if you do not have one) for your aftercare needs so that they can reassess your need for medications and monitor your lab values.   Increase activity slowly    Complete by:  As directed    Increase activity slowly    Complete by:  As directed      Allergies as of  01/08/2017      Reactions   Latex Rash      Medication List    STOP taking these medications   amLODipine 10 MG tablet Commonly known as:  NORVASC   insulin NPH-regular Human (70-30) 100 UNIT/ML injection Commonly known as:  NOVOLIN 70/30   NEURONTIN 600 MG tablet Generic drug:  gabapentin Replaced by:  gabapentin 300 MG capsule   traMADol 50 MG tablet Commonly known as:  ULTRAM     TAKE these medications   amoxicillin-clavulanate 500-125 MG tablet Commonly known as:  AUGMENTIN Take 1 tablet (500 mg total) by mouth 2 (two) times daily.   aspirin EC 81 MG tablet Take 81 mg by mouth daily.   collagenase ointment Commonly known as:  SANTYL Apply topically daily.   gabapentin 300 MG capsule Commonly known as:  NEURONTIN Take 2 capsules (600 mg total) by mouth daily. Replaces:  NEURONTIN 600 MG tablet   insulin glargine 100 UNIT/ML injection Commonly known as:  LANTUS Inject 0.22 mLs (22 Units total) into the skin daily. Start taking on:  01/09/2017   metoprolol tartrate 25 MG tablet Commonly known as:  LOPRESSOR Take 0.5 tablets (12.5 mg total) by mouth 2 (two) times daily.   sertraline 50 MG tablet Commonly known as:  ZOLOFT Take 50 mg by mouth daily.         Diet and Activity recommendation: See Discharge Instructions above   Consults obtained -    Pulmonary  Infectious disease   Major procedures and Radiology Reports - PLEASE review detailed and final reports for all details, in brief -     Ct Abdomen Pelvis Wo Contrast  Result Date: 01/07/2017 CLINICAL DATA:  Fever EXAM: CT ABDOMEN AND PELVIS  WITHOUT CONTRAST TECHNIQUE: Multidetector CT imaging of the abdomen and pelvis was performed following the standard protocol without IV contrast. COMPARISON:  None. FINDINGS: Lower chest: Bilateral small pleural effusions. Bilateral lower lobe airspace disease with air bronchograms concerning for pneumonia. Hepatobiliary: No focal liver abnormality is  seen. Status post cholecystectomy. No biliary dilatation. Pancreas: Unremarkable. No pancreatic ductal dilatation or surrounding inflammatory changes. Spleen: Normal in size without focal abnormality. Adrenals/Urinary Tract: Normal adrenal glands. Normal kidneys. No urolithiasis or obstructive uropathy. Normal bladder. Stomach/Bowel: No bowel wall thickening or bowel dilatation. No pneumatosis, pneumoperitoneum or portal venous gas. No extraluminal contrast. No abdominal or pelvic free fluid. Vascular/Lymphatic: No significant vascular findings are present. No enlarged abdominal or pelvic lymph nodes. Reproductive: Uterus and bilateral adnexa are unremarkable. Other: No abdominal wall hernia or abnormality. No abdominopelvic ascites. Musculoskeletal: No acute osseous abnormality. No lytic or sclerotic osseous lesion. IMPRESSION: 1. Bilateral small pleural effusions and bilateral lower lobe airspace disease concerning for multilobar pneumonia. 2. No acute abdominal or pelvic pathology. Electronically Signed   By: Elige Ko   On: 01/07/2017 19:01   Ct Chest Wo Contrast  Result Date: 01/07/2017 CLINICAL DATA:  Fever and congestion. EXAM: CT CHEST WITHOUT CONTRAST TECHNIQUE: Multidetector CT imaging of the chest was performed following the standard protocol without IV contrast. COMPARISON:  CT scan Dec 30, 2016.  Chest x-ray Jan 06, 2017. FINDINGS: Cardiovascular: The heart is unchanged. The central great vessels are normal in caliber. Mediastinum/Nodes: Shotty nodes in the mediastinum are likely reactive. The esophagus and thyroid are unchanged and unremarkable. Lungs/Pleura: Central airways are normal. No pneumothorax. Small bilateral pleural effusions with bibasilar consolidations remain, similar in the interval. Scattered upper lobe opacities have improved. No overt edema. Scattered pulmonary nodules described on the previous CT scan. Please see that report. No suspicious masses. Upper Abdomen: No acute  abnormality. Musculoskeletal: No chest wall mass or suspicious bone lesions identified. IMPRESSION: 1. Small bilateral pleural effusions with associated lower lobe consolidations, similar in the interval, worrisome for pneumonia. Upper lobe scattered opacities have improved. No overt edema. 2. Scattered pulmonary nodules fully described on the previous CT scan. Please see that report. No change. Electronically Signed   By: Gerome Sam III M.D   On: 01/07/2017 09:12   Ct Chest Wo Contrast  Result Date: 12/30/2016 CLINICAL DATA:  Hypoxemia EXAM: CT CHEST WITHOUT CONTRAST TECHNIQUE: Multidetector CT imaging of the chest was performed following the standard protocol without IV contrast. COMPARISON:  Radiograph 12/30/2016 FINDINGS: Cardiovascular: Limited without intravenous contrast. Non aneurysmal aorta. Scattered atherosclerotic calcification. Heart size upper normal. No large pericardial effusion. Mediastinum/Nodes: Mild mediastinal adenopathy. For example a lymph node adjacent to the aortic arch measures 9 mm. Midline trachea. A right paratracheal lymph node measures 1 cm. No thyroid mass. Esophagus within normal limits. Lungs/Pleura: Small bilateral pleural effusions. Extensive consolidation within the bilateral lower lobes with scattered ground-glass densities and partial consolidations in the bilateral upper lobes, relative sparing of the right middle lobe. There are multiple subcentimeter pulmonary nodules, right greater than left. Suspect that these are inflammatory or infectious. No pneumothorax Upper Abdomen: No acute abnormality Musculoskeletal: No acute or suspicious bone lesion. Old left fifth and sixth rib fractures. IMPRESSION: 1. Small bilateral pleural effusions with extensive bilateral lower lobe consolidations with additional ground-glass density and multifocal consolidations in the upper lobes, concerning for multifocal pneumonia. 2. There are multiple subcentimeter bilateral pulmonary  nodules, right greater than left, suspect that these are inflammatory or infectious. Non-contrast chest CT  at 3-6 months is recommended. If the nodules are stable at time of repeat CT, then future CT at 18-24 months (from today's scan) is considered optional for low-risk patients, but is recommended for high-risk patients. This recommendation follows the consensus statement: Guidelines for Management of Incidental Pulmonary Nodules Detected on CT Images: From the Fleischner Society 2017; Radiology 2017; 284:228-243. 3. There is mild mediastinal adenopathy, which may be reactive. Electronically Signed   By: Jasmine Pang M.D.   On: 12/30/2016 18:44   Nm Pulmonary Perf And Vent  Result Date: 01/07/2017 CLINICAL DATA:  Hypoxia EXAM: NUCLEAR MEDICINE VENTILATION - PERFUSION LUNG SCAN TECHNIQUE: Ventilation images were obtained in multiple projections using inhaled aerosol Tc-87m DTPA. Perfusion images were obtained in multiple projections after intravenous injection of Tc-58m MAA. RADIOPHARMACEUTICALS:  30.5 mCi Technetium-57m DTPA aerosol inhalation and 4.09 mCi Technetium-63m MAA IV COMPARISON:  None Correlation:  Chest radiograph 01/06/2017 FINDINGS: Ventilation: Central every deposition of aerosol. Diminished aerosol distribution to the periphery of the upper lobes. No discrete segmental or subsegmental ventilation defects Perfusion: Normal Chest radiograph:  CHF IMPRESSION: Normal perfusion lung scan. Electronically Signed   By: Ulyses Southward M.D.   On: 01/07/2017 12:18   Dg Chest Port 1 View  Result Date: 01/06/2017 CLINICAL DATA:  Hypoxia.  Chest pain . EXAM: PORTABLE CHEST 1 VIEW COMPARISON:  01/05/2017 . FINDINGS: Cardiomegaly with diffuse bilateral from interstitial prominence and bilateral pleural effusions. Findings consistent with CHF. Low lung volumes with basilar atelectasis. Bibasilar pneumonia cannot be excluded. No pneumothorax. Similar findings noted on prior exam . IMPRESSION: Cardiomegaly with  diffuse bilateral interstitial edema and bilateral pleural effusions consistent CHF. Low lung volumes with basilar atelectasis. Bibasilar pneumonia cannot be excluded. Similar findings noted on prior exam. Electronically Signed   By: Maisie Fus  Register   On: 01/06/2017 13:59   Dg Chest Port 1 View  Result Date: 01/05/2017 CLINICAL DATA:  51 year old female with a history of respiratory failure EXAM: PORTABLE CHEST 1 VIEW COMPARISON:  01/03/2017, 01/02/2017 FINDINGS: Cardiomediastinal silhouette unchanged. Low lung volumes with opacities at the bilateral lung bases, slightly improved from the prior. Mild interlobular septal thickening bilaterally. Fullness in the central vascular. No pneumothorax. Obscuration of the left and right hemidiaphragms as well as the left heart border. No displaced fracture IMPRESSION: Slight improvement of bibasilar opacities, likely combination of edema, pleural effusion, atelectasis/consolidation. Electronically Signed   By: Gilmer Mor D.O.   On: 01/05/2017 07:41   Dg Chest Port 1 View  Result Date: 01/03/2017 CLINICAL DATA:  Pneumonia. EXAM: PORTABLE CHEST 1 VIEW COMPARISON:  01/02/2017 .  CT 12/30/2016 FINDINGS: Mediastinum hilar structures normal. Heart size is stable. Diffuse bilateral pulmonary infiltrates and/or edema again noted. Small bilateral pleural effusions again noted. Chest is unchanged from prior exam . IMPRESSION: Diffuse bilateral pulmonary infiltrates and/or edema again noted. Small bilateral pleural effusions again noted. No significant interim change from prior exams. Electronically Signed   By: Maisie Fus  Register   On: 01/03/2017 07:35   Dg Chest Port 1 View  Result Date: 01/02/2017 CLINICAL DATA:  51 year old female with history of pneumonia. Shortness of breath. EXAM: PORTABLE CHEST 1 VIEW COMPARISON:  Chest x-ray 01/01/2017. FINDINGS: Lung volumes are normal. Extensive multifocal interstitial and airspace disease most confluent throughout the mid to  lower lungs bilaterally. Moderate bilateral pleural effusions. Cephalization of the pulmonary vasculature. Mild cardiomegaly. The patient is rotated to the left on today's exam, resulting in distortion of the mediastinal contours and reduced diagnostic sensitivity and specificity for  mediastinal pathology. Atherosclerosis in the thoracic aorta. IMPRESSION: 1. The appearance the chest is very similar to prior studies. Based on comparison with recent CT examination, this presumably reflects a combination of severe multilobar pneumonia (most evident throughout the mid to lower lungs), superimposed upon a background of mild interstitial pulmonary edema from congestive heart failure, as above. 2. Aortic atherosclerosis. Electronically Signed   By: Trudie Reed M.D.   On: 01/02/2017 07:14   Dg Chest Port 1 View  Result Date: 01/01/2017 CLINICAL DATA:  Diabetes, HTN, hx of MI, PNA EXAM: PORTABLE CHEST 1 VIEW COMPARISON:  12/30/2016 FINDINGS: There is evidence of mild worsening of lung aeration most evident in the lung bases, some of which may be due to differences in patient positioning. Bilateral pleural effusions appear larger. No pneumothorax. No other change. IMPRESSION: 1. Mild worsening in lung aeration consistent with worsened interstitial airspace pulmonary edema and an increase in pleural effusions. Electronically Signed   By: Amie Portland M.D.   On: 01/01/2017 07:31   Dg Chest Port 1 View  Result Date: 12/30/2016 CLINICAL DATA:  Post cardiac arrest EXAM: PORTABLE CHEST 1 VIEW COMPARISON:  12/30/2016 FINDINGS: Borderline cardiomegaly. Small bilateral effusions. Central vascular congestion with worsened bilateral pulmonary edema. Bibasilar consolidations. No pneumothorax. IMPRESSION: 1. Borderline cardiomegaly with worsened central vascular congestion and pulmonary edema. 2. There are bilateral pleural effusions and bibasilar infiltrates. Electronically Signed   By: Jasmine Pang M.D.   On: 12/30/2016  20:35   Dg Chest Port 1 View  Result Date: 12/30/2016 CLINICAL DATA:  Respiratory failure for 4 days. EXAM: PORTABLE CHEST 1 VIEW COMPARISON:  None. FINDINGS: The cardiomediastinal silhouette is unremarkable. Pulmonary vascular congestion and possible interstitial edema noted. Bilateral opacities within the mid and lower lungs bilaterally noted with trace bilateral pleural effusions. There is no evidence of pneumothorax. No acute bony abnormalities are identified. IMPRESSION: Bilateral lower lung opacities which may represent airspace disease/edema and/or atelectasis. Trace bilateral pleural effusions. Pulmonary vascular congestion and possible interstitial pulmonary edema. Electronically Signed   By: Harmon Pier M.D.   On: 12/30/2016 13:29    Micro Results    Recent Results (from the past 240 hour(s))  Culture, blood (routine x 2) Call MD if unable to obtain prior to antibiotics being given     Status: None   Collection Time: 12/30/16 12:54 PM  Result Value Ref Range Status   Specimen Description BLOOD RIGHT ARM  Final   Special Requests IN PEDIATRIC BOTTLE Blood Culture adequate volume  Final   Culture NO GROWTH 5 DAYS  Final   Report Status 01/04/2017 FINAL  Final  Culture, blood (routine x 2) Call MD if unable to obtain prior to antibiotics being given     Status: None   Collection Time: 12/30/16 12:54 PM  Result Value Ref Range Status   Specimen Description BLOOD RIGHT HAND  Final   Special Requests IN PEDIATRIC BOTTLE Blood Culture adequate volume  Final   Culture NO GROWTH 5 DAYS  Final   Report Status 01/04/2017 FINAL  Final  Urine culture     Status: None   Collection Time: 12/30/16  4:36 PM  Result Value Ref Range Status   Specimen Description URINE, RANDOM  Final   Special Requests NONE  Final   Culture NO GROWTH  Final   Report Status 12/31/2016 FINAL  Final  Urine culture     Status: None   Collection Time: 12/30/16  8:42 PM  Result Value Ref Range Status  Specimen  Description URINE, CLEAN CATCH  Final   Special Requests Immunocompromised  Final   Culture NO GROWTH  Final   Report Status 01/01/2017 FINAL  Final  MRSA PCR Screening     Status: None   Collection Time: 12/30/16  8:44 PM  Result Value Ref Range Status   MRSA by PCR NEGATIVE NEGATIVE Final    Comment:        The GeneXpert MRSA Assay (FDA approved for NASAL specimens only), is one component of a comprehensive MRSA colonization surveillance program. It is not intended to diagnose MRSA infection nor to guide or monitor treatment for MRSA infections.   Respiratory Panel by PCR     Status: None   Collection Time: 12/31/16  4:50 PM  Result Value Ref Range Status   Adenovirus NOT DETECTED NOT DETECTED Final   Coronavirus 229E NOT DETECTED NOT DETECTED Final   Coronavirus HKU1 NOT DETECTED NOT DETECTED Final   Coronavirus NL63 NOT DETECTED NOT DETECTED Final   Coronavirus OC43 NOT DETECTED NOT DETECTED Final   Metapneumovirus NOT DETECTED NOT DETECTED Final   Rhinovirus / Enterovirus NOT DETECTED NOT DETECTED Final   Influenza A NOT DETECTED NOT DETECTED Final   Influenza B NOT DETECTED NOT DETECTED Final   Parainfluenza Virus 1 NOT DETECTED NOT DETECTED Final   Parainfluenza Virus 2 NOT DETECTED NOT DETECTED Final   Parainfluenza Virus 3 NOT DETECTED NOT DETECTED Final   Parainfluenza Virus 4 NOT DETECTED NOT DETECTED Final   Respiratory Syncytial Virus NOT DETECTED NOT DETECTED Final   Bordetella pertussis NOT DETECTED NOT DETECTED Final   Chlamydophila pneumoniae NOT DETECTED NOT DETECTED Final   Mycoplasma pneumoniae NOT DETECTED NOT DETECTED Final       Today   Subjective:   Cynthia Dillon today Denies any headache, chest pain, shortness of breath, no weakness, feels much better and wants to go home today has no headache,no chest abdominal pain,no new weakness tingling or numbness, feels much better wants to go home today.   Objective:   Blood pressure 121/66, pulse  88, temperature 98.1 F (36.7 C), temperature source Oral, resp. rate 19, height 5\' 4"  (1.626 m), weight 79.6 kg (175 lb 7.4 oz), last menstrual period 12/07/2016, SpO2 98 %.   Intake/Output Summary (Last 24 hours) at 01/08/17 1000 Last data filed at 01/08/17 0916  Gross per 24 hour  Intake              600 ml  Output                0 ml  Net              600 ml    Exam Awake Alert, Oriented x 3, No new F.N deficits, Normal affect Supple neck, no JVD Good air entry bilaterally, bibasilar Rales, improving, no wheezing  RRR,No Gallops,Rubs or new Murmurs, No Parasternal Heave +ve B.Sounds, Abd Soft, Non tender, No rebound -guarding or rigidity. No Cyanosis, Clubbing or edema, left foot with transmetatarsal amputation, with 2 small healing  ulcers,   Data Review   CBC w Diff:  Lab Results  Component Value Date   WBC 7.7 01/05/2017   HGB 8.1 (L) 01/05/2017   HCT 25.9 (L) 01/05/2017   PLT 328 01/05/2017   LYMPHOPCT 19 01/03/2017   MONOPCT 6 01/03/2017   EOSPCT 1 01/03/2017   BASOPCT 1 01/03/2017    CMP:  Lab Results  Component Value Date   NA 131 (L) 01/07/2017  K 4.8 01/07/2017   CL 97 (L) 01/07/2017   CO2 24 01/07/2017   BUN 44 (H) 01/07/2017   CREATININE 3.81 (H) 01/07/2017   PROT 5.9 (L) 01/02/2017   ALBUMIN 1.7 (L) 01/02/2017   BILITOT 0.5 01/02/2017   ALKPHOS 122 01/02/2017   AST 18 01/02/2017   ALT 19 01/02/2017  .   Total Time in preparing paper work, data evaluation and todays exam - 35 minutes  Aliayah Tyer M.D on 01/08/2017 at 10:00 AM  [facility]   Office  [phone]

## 2017-01-12 LAB — CULTURE, BLOOD (ROUTINE X 2)
Culture: NO GROWTH
Culture: NO GROWTH
SPECIAL REQUESTS: ADEQUATE
SPECIAL REQUESTS: ADEQUATE

## 2017-01-13 LAB — GLUCOSE, CAPILLARY
GLUCOSE-CAPILLARY: 165 mg/dL — AB (ref 65–99)
GLUCOSE-CAPILLARY: 130 mg/dL — AB (ref 65–99)
GLUCOSE-CAPILLARY: 152 mg/dL — AB (ref 65–99)
GLUCOSE-CAPILLARY: 256 mg/dL — AB (ref 65–99)
GLUCOSE-CAPILLARY: 122 mg/dL — AB (ref 65–99)
GLUCOSE-CAPILLARY: 274 mg/dL — AB (ref 65–99)

## 2017-05-28 DIAGNOSIS — N189 Chronic kidney disease, unspecified: Secondary | ICD-10-CM

## 2017-05-28 DIAGNOSIS — J9691 Respiratory failure, unspecified with hypoxia: Secondary | ICD-10-CM

## 2017-05-28 DIAGNOSIS — S98912A Complete traumatic amputation of left foot, level unspecified, initial encounter: Secondary | ICD-10-CM | POA: Diagnosis not present

## 2017-05-28 DIAGNOSIS — I509 Heart failure, unspecified: Secondary | ICD-10-CM

## 2017-05-28 DIAGNOSIS — N179 Acute kidney failure, unspecified: Secondary | ICD-10-CM | POA: Diagnosis not present

## 2017-05-28 DIAGNOSIS — E119 Type 2 diabetes mellitus without complications: Secondary | ICD-10-CM | POA: Diagnosis not present

## 2017-05-29 DIAGNOSIS — S98912A Complete traumatic amputation of left foot, level unspecified, initial encounter: Secondary | ICD-10-CM | POA: Diagnosis not present

## 2017-05-29 DIAGNOSIS — N189 Chronic kidney disease, unspecified: Secondary | ICD-10-CM | POA: Diagnosis not present

## 2017-05-29 DIAGNOSIS — J9691 Respiratory failure, unspecified with hypoxia: Secondary | ICD-10-CM | POA: Diagnosis not present

## 2017-05-29 DIAGNOSIS — N179 Acute kidney failure, unspecified: Secondary | ICD-10-CM | POA: Diagnosis not present

## 2017-05-29 DIAGNOSIS — E860 Dehydration: Secondary | ICD-10-CM | POA: Diagnosis not present

## 2017-05-29 DIAGNOSIS — I509 Heart failure, unspecified: Secondary | ICD-10-CM | POA: Diagnosis not present

## 2017-05-30 DIAGNOSIS — S98912A Complete traumatic amputation of left foot, level unspecified, initial encounter: Secondary | ICD-10-CM | POA: Diagnosis not present

## 2017-05-30 DIAGNOSIS — N189 Chronic kidney disease, unspecified: Secondary | ICD-10-CM | POA: Diagnosis not present

## 2017-05-30 DIAGNOSIS — I509 Heart failure, unspecified: Secondary | ICD-10-CM | POA: Diagnosis not present

## 2017-05-30 DIAGNOSIS — E860 Dehydration: Secondary | ICD-10-CM

## 2017-05-30 DIAGNOSIS — R0602 Shortness of breath: Secondary | ICD-10-CM | POA: Diagnosis not present

## 2017-05-30 DIAGNOSIS — J9691 Respiratory failure, unspecified with hypoxia: Secondary | ICD-10-CM | POA: Diagnosis not present

## 2017-05-31 DIAGNOSIS — J9691 Respiratory failure, unspecified with hypoxia: Secondary | ICD-10-CM | POA: Diagnosis not present

## 2017-05-31 DIAGNOSIS — S98912A Complete traumatic amputation of left foot, level unspecified, initial encounter: Secondary | ICD-10-CM | POA: Diagnosis not present

## 2017-05-31 DIAGNOSIS — I509 Heart failure, unspecified: Secondary | ICD-10-CM | POA: Diagnosis not present

## 2017-05-31 DIAGNOSIS — N189 Chronic kidney disease, unspecified: Secondary | ICD-10-CM | POA: Diagnosis not present

## 2017-06-01 DIAGNOSIS — N182 Chronic kidney disease, stage 2 (mild): Secondary | ICD-10-CM | POA: Diagnosis not present

## 2017-06-01 DIAGNOSIS — N179 Acute kidney failure, unspecified: Secondary | ICD-10-CM

## 2017-06-01 DIAGNOSIS — E872 Acidosis: Secondary | ICD-10-CM

## 2017-06-01 DIAGNOSIS — E860 Dehydration: Secondary | ICD-10-CM

## 2017-06-01 DIAGNOSIS — E875 Hyperkalemia: Secondary | ICD-10-CM

## 2017-06-01 DIAGNOSIS — E119 Type 2 diabetes mellitus without complications: Secondary | ICD-10-CM | POA: Diagnosis not present

## 2017-06-02 DIAGNOSIS — R55 Syncope and collapse: Secondary | ICD-10-CM | POA: Diagnosis not present

## 2017-06-02 DIAGNOSIS — N179 Acute kidney failure, unspecified: Secondary | ICD-10-CM | POA: Diagnosis not present

## 2017-06-02 DIAGNOSIS — E872 Acidosis: Secondary | ICD-10-CM | POA: Diagnosis not present

## 2017-06-02 DIAGNOSIS — E860 Dehydration: Secondary | ICD-10-CM | POA: Diagnosis not present

## 2017-06-02 DIAGNOSIS — E875 Hyperkalemia: Secondary | ICD-10-CM | POA: Diagnosis not present

## 2017-06-03 DIAGNOSIS — E872 Acidosis: Secondary | ICD-10-CM | POA: Diagnosis not present

## 2017-06-03 DIAGNOSIS — N179 Acute kidney failure, unspecified: Secondary | ICD-10-CM | POA: Diagnosis not present

## 2017-06-03 DIAGNOSIS — E875 Hyperkalemia: Secondary | ICD-10-CM | POA: Diagnosis not present

## 2017-06-03 DIAGNOSIS — E860 Dehydration: Secondary | ICD-10-CM | POA: Diagnosis not present

## 2017-06-03 DIAGNOSIS — N182 Chronic kidney disease, stage 2 (mild): Secondary | ICD-10-CM | POA: Diagnosis not present

## 2017-06-15 DEATH — deceased

## 2019-02-27 IMAGING — NM NM PULMONARY VENT & PERF
16 series · 16 of 16 positions shown · non-contrast
Comparison: None

Correlation:  Chest radiograph 01/06/2017

CLINICAL DATA: Hypoxia

EXAM:
NUCLEAR MEDICINE VENTILATION - PERFUSION LUNG SCAN
TECHNIQUE: Ventilation images were obtained in multiple projections using
inhaled aerosol 5c-AAm DTPA. Perfusion images were obtained in
multiple projections after intravenous injection of 5c-AAm MAA.
RADIOPHARMACEUTICALS:  30.5 mCi 6echnetium-66m DTPA aerosol
inhalation and 4.09 mCi 6echnetium-66m MAA IV

[Series 1: ant/post vent · 4.14mm/px · 1 of 1 slices shown (1 of 2)]
[im 1/1  full-range]
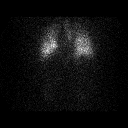

[Series 1: ant/post vent · 4.14mm/px · 1 of 1 slices shown (2 of 2)]
[im 1/1]
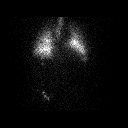

[Series 2: lao/rpo vent · 4.14mm/px · 1 of 1 slices shown (1 of 2)]
[im 1/1  full-range]
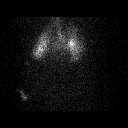

[Series 2: lao/rpo vent · 4.14mm/px · 1 of 1 slices shown (2 of 2)]
[im 1/1  full-range]
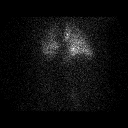

[Series 3: lpo/rao vent · 4.14mm/px · 1 of 1 slices shown (1 of 2)]
[im 1/1  full-range]
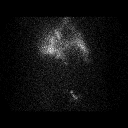

[Series 3: lpo/rao vent · 4.14mm/px · 1 of 1 slices shown (2 of 2)]
[im 1/1  full-range]
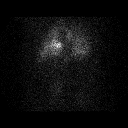

[Series 4: lt lat/rt lat vent · 4.14mm/px · 1 of 1 slices shown (1 of 2)]
[im 1/1  full-range]
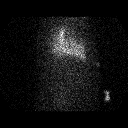

[Series 4: lt lat/rt lat vent · 4.14mm/px · 1 of 1 slices shown (2 of 2)]
[im 1/1  full-range]
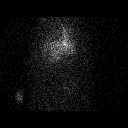

[Series 5: lt lat/rt lat perf · 4.14mm/px · 1 of 1 slices shown (1 of 2)]
[im 1/1]
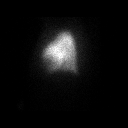

[Series 5: lt lat/rt lat perf · 4.14mm/px · 1 of 1 slices shown (2 of 2)]
[im 1/1]
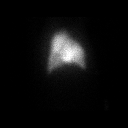

[Series 6: lpo/rao perf · 4.14mm/px · 1 of 1 slices shown (1 of 2)]
[im 1/1]
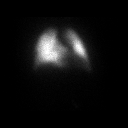

[Series 6: lpo/rao perf · 4.14mm/px · 1 of 1 slices shown (2 of 2)]
[im 1/1]
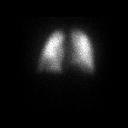

[Series 7: ant/post perf · 4.14mm/px · 1 of 1 slices shown (1 of 2)]
[im 1/1]
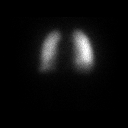

[Series 7: ant/post perf · 4.14mm/px · 1 of 1 slices shown (2 of 2)]
[im 1/1]
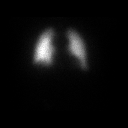

[Series 8: lao/rpo perf · 4.14mm/px · 1 of 1 slices shown (1 of 2)]
[im 1/1]
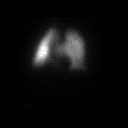

[Series 8: lao/rpo perf · 4.14mm/px · 1 of 1 slices shown (2 of 2)]
[im 1/1]
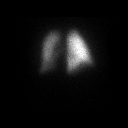

[16 of 16 positions shown; findings below may reference images not displayed]

FINDINGS: Ventilation: Central every deposition of aerosol. Diminished aerosol
distribution to the periphery of the upper lobes. No discrete
segmental or subsegmental ventilation defects

Perfusion: Normal

Chest radiograph:  CHF
IMPRESSION: Normal perfusion lung scan.
